# Patient Record
Sex: Female | Born: 1960 | Race: White | Hispanic: No | Marital: Married | Smoking: Never smoker
Health system: Southern US, Community
[De-identification: ages and names within clinical notes are randomized; demographics above are authoritative.]

## PROBLEM LIST (undated history)

## (undated) DIAGNOSIS — D649 Anemia, unspecified: Secondary | ICD-10-CM

## (undated) DIAGNOSIS — T8859XA Other complications of anesthesia, initial encounter: Secondary | ICD-10-CM

## (undated) DIAGNOSIS — T4145XA Adverse effect of unspecified anesthetic, initial encounter: Secondary | ICD-10-CM

## (undated) DIAGNOSIS — R011 Cardiac murmur, unspecified: Secondary | ICD-10-CM

## (undated) DIAGNOSIS — M8580 Other specified disorders of bone density and structure, unspecified site: Secondary | ICD-10-CM

## (undated) DIAGNOSIS — Z8489 Family history of other specified conditions: Secondary | ICD-10-CM

## (undated) DIAGNOSIS — C50919 Malignant neoplasm of unspecified site of unspecified female breast: Secondary | ICD-10-CM

## (undated) DIAGNOSIS — E079 Disorder of thyroid, unspecified: Secondary | ICD-10-CM

## (undated) DIAGNOSIS — R112 Nausea with vomiting, unspecified: Secondary | ICD-10-CM

## (undated) DIAGNOSIS — J302 Other seasonal allergic rhinitis: Secondary | ICD-10-CM

## (undated) DIAGNOSIS — Z9889 Other specified postprocedural states: Secondary | ICD-10-CM

## (undated) DIAGNOSIS — I059 Rheumatic mitral valve disease, unspecified: Secondary | ICD-10-CM

## (undated) HISTORY — DX: Rheumatic mitral valve disease, unspecified: I05.9

## (undated) HISTORY — PX: COLONOSCOPY: SHX174

## (undated) HISTORY — DX: Malignant neoplasm of unspecified site of unspecified female breast: C50.919

## (undated) HISTORY — DX: Disorder of thyroid, unspecified: E07.9

## (undated) HISTORY — DX: Cardiac murmur, unspecified: R01.1

## (undated) HISTORY — PX: OTHER SURGICAL HISTORY: SHX169

## (undated) HISTORY — DX: Anemia, unspecified: D64.9

## (undated) HISTORY — DX: Other specified disorders of bone density and structure, unspecified site: M85.80

## (undated) HISTORY — PX: ANAL FISSURE REPAIR: SHX2312

## (undated) SURGERY — Surgical Case
Anesthesia: *Unknown

---
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
## 2008-01-02 ENCOUNTER — Encounter: Admission: RE | Admit: 2008-01-02 | Discharge: 2008-01-02 | Payer: Self-pay | Admitting: Internal Medicine

## 2008-08-26 ENCOUNTER — Encounter: Admission: RE | Admit: 2008-08-26 | Discharge: 2008-08-26 | Payer: Self-pay | Admitting: Internal Medicine

## 2009-11-19 ENCOUNTER — Encounter: Admission: RE | Admit: 2009-11-19 | Discharge: 2009-11-19 | Payer: Self-pay | Admitting: Family Medicine

## 2013-07-29 ENCOUNTER — Ambulatory Visit (INDEPENDENT_AMBULATORY_CARE_PROVIDER_SITE_OTHER): Payer: BC Managed Care – PPO | Admitting: Interventional Cardiology

## 2013-07-29 ENCOUNTER — Encounter: Payer: Self-pay | Admitting: Interventional Cardiology

## 2013-07-29 VITALS — BP 114/74 | HR 47 | Ht 63.0 in | Wt 114.1 lb

## 2013-07-29 DIAGNOSIS — R7982 Elevated C-reactive protein (CRP): Secondary | ICD-10-CM

## 2013-07-29 DIAGNOSIS — I059 Rheumatic mitral valve disease, unspecified: Secondary | ICD-10-CM

## 2013-07-29 HISTORY — DX: Rheumatic mitral valve disease, unspecified: I05.9

## 2013-07-29 NOTE — Progress Notes (Signed)
Patient ID: Kara Kim, female   DOB: 1961-02-22, 52 y.o.   MRN: [id number]    HPI  Kara Kim is done well since the last office visit. Her great concern about an elevated hs CRP has been lessened. She saw a natural pathic physician, who suggested that she had dietary allergies. After changing her diet, the CRP level has significantly decreased. He denies any cardiac symptoms. She is exercising regularly. She wonders if she needs to take aspirin.  Allergies  Allergen Reactions  . Eggs Or Egg-Derived Products Swelling  . Sulfur Other (See Comments)    Levonne Spiller Syndrome    Current Outpatient Prescriptions  Medication Sig Dispense Refill  . Bisacodyl (LAXATIVE PO) Take 8.6 mg by mouth 2 (two) times daily.      . Cholecalciferol (VITAMIN D-3) 1000 UNITS CAPS Take 2 Units by mouth daily.      . fexofenadine (ALLEGRA) 180 MG tablet Take 180 mg by mouth daily.      . Multiple Vitamin (MULTIVITAMIN) tablet Take 1 tablet by mouth daily.      . Nutritional Supplements (DHEA PO) Take 15 mg by mouth daily.      . Omega-3 Fatty Acids (SUPER OMEGA-3 PO) Take 1 tablet by mouth daily.      . Pregnenolone Micronized POWD 1 tablet by Does not apply route.      . Probiotic Product (PROBIOTIC DAILY PO) Take 1 tablet by mouth daily.      Marland Kitchen thyroid (ARMOUR) 65 MG tablet Take 65 mg by mouth daily.      . TURMERIC CURCUMIN PO Take 665 mg by mouth daily.       No current facility-administered medications for this visit.    History reviewed. No pertinent past medical history.  History reviewed. No pertinent past surgical history.  ROS: Negative  PHYSICAL EXAM BP 114/74  Pulse 47  Ht 5\' 3"  (1.6 m)  Wt 114 lb 1.9 oz (51.764 kg)  BMI 20.22 kg/m2 No murmur, rub, or click. Chest is clear Extremities no edema.  EKG: Sinus bradycardia. First grade AV block. Otherwise, normal tracing.  ASSESSMENT AND PLAN  1. History of mitral valve prolapse, asymptomatic and clinically without evidence.  2.  Elevated C reactive protein, normalize with changes in her diet. I've advised her that it would be okay to discontinue aspirin.  Overall, the patient is doing well and has no particular reason for cardiac followup unless symptoms.

## 2013-07-29 NOTE — Patient Instructions (Addendum)
Your physician recommends that you schedule a follow-up appointment in: AS NEEDED  

## 2013-08-26 ENCOUNTER — Encounter: Payer: Self-pay | Admitting: Interventional Cardiology

## 2016-09-08 DIAGNOSIS — Z719 Counseling, unspecified: Secondary | ICD-10-CM | POA: Insufficient documentation

## 2017-01-03 ENCOUNTER — Ambulatory Visit (INDEPENDENT_AMBULATORY_CARE_PROVIDER_SITE_OTHER): Payer: Self-pay | Admitting: *Deleted

## 2017-01-03 DIAGNOSIS — I781 Nevus, non-neoplastic: Secondary | ICD-10-CM

## 2017-01-03 NOTE — Progress Notes (Signed)
X=.3% Sotradecol administered with a 27g butterfly.  Patient received a total of 2.5cc.  Cutaneous Laser:pulsed mode  810j/cm2 400 ms delay  13 ms Duration 0.5 spot  Total pulses: 559 Total energy 884.98  Total time::07  This patient really has minimal spider veins but she wanted treatment. Used a combo of sclero and cutaneous laser. Tol well. Will follow this nice lady prn.   Compression stockings applied: Yes.

## 2019-12-17 ENCOUNTER — Other Ambulatory Visit: Payer: Self-pay | Admitting: Obstetrics and Gynecology

## 2019-12-17 DIAGNOSIS — R928 Other abnormal and inconclusive findings on diagnostic imaging of breast: Secondary | ICD-10-CM

## 2019-12-22 HISTORY — PX: BREAST BIOPSY: SHX20

## 2019-12-24 ENCOUNTER — Ambulatory Visit
Admission: RE | Admit: 2019-12-24 | Discharge: 2019-12-24 | Disposition: A | Discharge status: Home / Self Care | Source: Ambulatory Visit | Attending: Obstetrics and Gynecology | Admitting: Obstetrics and Gynecology

## 2019-12-24 ENCOUNTER — Other Ambulatory Visit: Payer: Self-pay

## 2019-12-24 ENCOUNTER — Other Ambulatory Visit: Payer: Self-pay | Admitting: Obstetrics and Gynecology

## 2019-12-24 DIAGNOSIS — R928 Other abnormal and inconclusive findings on diagnostic imaging of breast: Secondary | ICD-10-CM

## 2019-12-24 DIAGNOSIS — R921 Mammographic calcification found on diagnostic imaging of breast: Secondary | ICD-10-CM

## 2019-12-26 ENCOUNTER — Ambulatory Visit
Admission: RE | Admit: 2019-12-26 | Discharge: 2019-12-26 | Disposition: A | Discharge status: Home / Self Care | Source: Ambulatory Visit | Attending: Obstetrics and Gynecology | Admitting: Obstetrics and Gynecology

## 2019-12-26 ENCOUNTER — Other Ambulatory Visit: Payer: Self-pay

## 2019-12-26 DIAGNOSIS — R921 Mammographic calcification found on diagnostic imaging of breast: Secondary | ICD-10-CM

## 2020-01-02 ENCOUNTER — Ambulatory Visit: Payer: Self-pay | Admitting: General Surgery

## 2020-01-02 DIAGNOSIS — C50411 Malignant neoplasm of upper-outer quadrant of right female breast: Secondary | ICD-10-CM

## 2020-01-02 DIAGNOSIS — Z170 Estrogen receptor positive status [ER+]: Secondary | ICD-10-CM

## 2020-01-06 ENCOUNTER — Other Ambulatory Visit: Payer: Self-pay | Admitting: General Surgery

## 2020-01-06 ENCOUNTER — Telehealth: Payer: Self-pay | Admitting: Oncology

## 2020-01-06 ENCOUNTER — Encounter: Payer: Self-pay | Admitting: Oncology

## 2020-01-06 DIAGNOSIS — C50411 Malignant neoplasm of upper-outer quadrant of right female breast: Secondary | ICD-10-CM

## 2020-01-06 NOTE — Telephone Encounter (Signed)
Received a new pt referral from Dr. Marlou Starks for dx of breast cancer. Ms. Kara Kim has been cld and scheduled to see Dr. Jana Kim on 3/17 at 4pm w/labs at 3:30pm. She's been made aware to arrive 15 minutes early.

## 2020-01-06 NOTE — Progress Notes (Signed)
[facility]  [phone] [phone]     ID: Kara Kim DOB: Mar 11, 1961  MR#: [id number]  XMI#:[id number]  Patient Care Team: Jonathon Jordan, MD as PCP - General (Family Medicine) [name], MD as Consulting Physician (General Surgery) Maddilyn Campus, Virgie Dad, MD as Consulting Physician (Oncology) [name], MD OTHER MD:  CHIEF COMPLAINT: estrogen receptor positive breast cancer  CURRENT TREATMENT: Definitive surgery pending   HISTORY OF CURRENT ILLNESS: Kara Kim had routine screening mammography showing a possible abnormality in the right breast. She underwent right diagnostic mammography with tomography at [facility] on 12/24/2019 showing: breast density category D; 0.6 cm grouped calcifications in the upper-outer right breast.  Accordingly on 12/26/2019 she proceeded to biopsy of the right breast area in question. 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 Prognostic indicators significant for: estrogen receptor, 100% positive and progesterone receptor, 100% positive, both with strong staining intensity. Proliferation marker Ki67 at 5%. HER2 negative by immunohistochemistry (1+).  The patient's subsequent history is as detailed below.   INTERVAL HISTORY: Shadia was evaluated in the breast cancer clinic on 01/07/2020. Her case was also presented at the multidisciplinary breast cancer conference on the same day. At that time a preliminary plan was proposed: Breast MRI (scheduled for 01/21/2020), then consideration of breast conserving surgery with sentinel lymph node sampling, Oncotype testing, adjuvant radiation and anti-estrogens   REVIEW OF SYSTEMS: There were no specific symptoms leading to the original mammogram, which was routinely scheduled. The patient denies unusual headaches, visual changes, nausea, vomiting,  stiff neck, dizziness, or gait imbalance. There has been no cough, phlegm production, or pleurisy, no chest pain or pressure, and no change in bowel or bladder habits. The patient denies fever, rash, bleeding, unexplained fatigue or unexplained weight loss. A detailed review of systems was otherwise entirely negative.   PAST MEDICAL HISTORY: Past Medical History:  Diagnosis Date  . Breast cancer ([place])   . Heart murmur   . Mitral valve disorders(424.0) 07/29/2013  . Thyroid disease   Persistently elevated CRP  PAST SURGICAL HISTORY: Past Surgical History:  Procedure Laterality Date  . CESAREAN SECTION    Anal fissure repair   FAMILY HISTORY: Family History  Problem Relation Age of Onset  . Lung cancer Mother   . Hypertension Father   . Hypertension Sister   . Seizures Son    She reports lung cancer in her mother, who was a smoker. She died age 52. The patient's father died age 76 from a ruptured aneurysm. His mother had cancer but the patient does not know much about it except "it was not an inherited type". The patient has 2 sisters, no brothers. One sister, [name], is an Materials engineer in [place].   GYNECOLOGIC HISTORY:  No LMP recorded. Menarche: 59 years old Age at first live birth: 59years old [name] 2 LMP mid-50's HRT yes, about a decade, ending at the time of breast cancer diagnosis (MAR 2021) Hysterectomy? no BSO? 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 At home it's just she and her husband Kara Kim, who does custom woodworking. Their 2 sons are [name], 72 and [name] 20, both studying art. The patient attends [employer] DIRECTIVES: in the absence of any document to the contrary the patient's husband is her HCPOA  HEALTH MAINTENANCE: Social History  Tobacco Use  . Smoking status: Never Smoker  Substance Use Topics  . Alcohol use: Yes    Alcohol/week: 4.0 standard drinks    Types: 4  Glasses of wine per week    Comment: occasional  . Drug use: No     Colonoscopy: 2012/ [facility]  PAP: UTD/ [name]  Bone density:    Allergies  Allergen Reactions  . Eggs Or Egg-Derived Products Swelling  . Sulfur Other (See Comments)    [name] Syndrome  . Sulfamethoxazole Rash    Current Outpatient Medications  Medication Sig Dispense Refill  . Biotin 5000 MCG CAPS Take by mouth.    . Bisacodyl (LAXATIVE PO) Take 8.6 mg by mouth 2 (two) times daily.    . Cholecalciferol (VITAMIN D-3) 1000 UNITS CAPS Take 2 Units by mouth daily.    . fexofenadine (ALLEGRA) 180 MG tablet Take 180 mg by mouth daily.    . Multiple Vitamin (MULTIVITAMIN) tablet Take 1 tablet by mouth daily.    . Nutritional Supplements (DHEA PO) Take 15 mg by mouth daily.    . Omega-3 Fatty Acids (SUPER OMEGA-3 PO) Take 1 tablet by mouth daily.    . Pregnenolone Micronized POWD 1 tablet by Does not apply route.    . Probiotic Product (PROBIOTIC DAILY PO) Take 1 tablet by mouth daily.    Marland Kitchen thyroid (ARMOUR) 60 MG tablet Take 60 mg by mouth daily before breakfast.    . TURMERIC CURCUMIN PO Take 665 mg by mouth daily.    Marland Kitchen UNABLE TO FIND Luteolin 50 mg complex    . vitamin k 100 MCG tablet Take 100 mcg by mouth daily. MK-7     No current facility-administered medications for this visit.    OBJECTIVE: middle aged White woman in no acute distress Vitals:   01/07/20 1613  BP: 127/68  Pulse: (!) 58  Resp: 18  Temp: 98.2 F (36.8 C)  SpO2: 100%     Body mass index is 21.29 kg/m.   Wt Readings from Last 3 Encounters:  01/07/20 120 lb 3.2 oz (54.5 kg)  07/29/13 114 lb 1.9 oz (51.8 kg)      ECOG FS:1 - Symptomatic but completely ambulatory  Ocular: Sclerae unicteric, pupils round and equal Ear-nose-throat: Wearing a mask Lymphatic: No cervical or supraclavicular adenopathy Lungs no rales or rhonchi Heart regular rate and rhythm Abd soft, nontender, positive bowel sounds MSK no focal spinal  tenderness, no joint edema Neuro: non-focal, well-oriented, appropriate affect Breasts: the Right breast is s/p recent biopsy; there is no significant ecchymosis; there is no palpable mass; the Left breast is unremarkable; both axillae are benign   LAB RESULTS:  CMP     Component Value Date/Time   NA 144 01/07/2020 1535   K 4.2 01/07/2020 1535   CL 106 01/07/2020 1535   CO2 28 01/07/2020 1535   GLUCOSE 159 (H) 01/07/2020 1535   BUN 13 01/07/2020 1535   CREATININE 1.04 (H) 01/07/2020 1535   CALCIUM 8.8 (L) 01/07/2020 1535   PROT 6.4 (L) 01/07/2020 1535   ALBUMIN 3.9 01/07/2020 1535   AST 23 01/07/2020 1535   ALT 16 01/07/2020 1535   ALKPHOS 62 01/07/2020 1535   BILITOT 0.6 01/07/2020 1535   GFRNONAA 59 (L) 01/07/2020 1535   GFRAA >60 01/07/2020 1535    No results found for: TOTALPROTELP, ALBUMINELP, A1GS, A2GS, BETS, BETA2SER, GAMS, MSPIKE, SPEI  Lab Results  Component Value Date   WBC 3.5 (L) 01/07/2020   NEUTROABS  2.4 01/07/2020   HGB 13.4 01/07/2020   HCT 39.9 01/07/2020   MCV 99.3 01/07/2020   PLT 184 01/07/2020    No results found for: LABCA2  No components found for: YKDXIP382  No results for input(s): INR in the last 168 hours.  No results found for: LABCA2  No results found for: NKN397  No results found for: QBH419  No results found for: FXT024  No results found for: CA2729  No components found for: HGQUANT  No results found for: CEA1 / No results found for: CEA1   No results found for: AFPTUMOR  No results found for: CHROMOGRNA  No results found for: KPAFRELGTCHN, LAMBDASER, KAPLAMBRATIO (kappa/lambda light chains)  No results found for: HGBA, HGBA2QUANT, HGBFQUANT, HGBSQUAN (Hemoglobinopathy evaluation)   No results found for: LDH  No results found for: IRON, TIBC, IRONPCTSAT (Iron and TIBC)  No results found for: FERRITIN  Urinalysis No results found for: COLORURINE, APPEARANCEUR, LABSPEC, PHURINE, GLUCOSEU, HGBUR, BILIRUBINUR,  KETONESUR, PROTEINUR, UROBILINOGEN, NITRITE, LEUKOCYTESUR   STUDIES: MM Digital Diagnostic Unilat R  Result Date: 12/24/2019 CLINICAL DATA:  59 year old female presenting as a recall from screening for possible right breast calcifications EXAM: DIGITAL DIAGNOSTIC RIGHT MAMMOGRAM COMPARISON:  Previous exam(s). ACR Breast Density Category d: The breast tissue is extremely dense, which lowers the sensitivity of mammography. FINDINGS: Spot magnification views were performed for the questioned calcifications in the upper-outer quadrant the right breast. On the additional imaging there is persistence of a 0.6 cm group of punctate and amorphous calcifications. No associated mass identified. IMPRESSION: Grouped calcifications spanning 0.6 cm in the upper outer quadrant of the right breast are indeterminate. Tissue sampling is recommended. RECOMMENDATION: Stereotactic core needle biopsy of the right breast. This will be scheduled at the patient's earliest convenience. BI-RADS CATEGORY  4: Suspicious. Electronically Signed   By: Audie Pinto M.D.   On: 12/24/2019 13:52   MM CLIP PLACEMENT RIGHT  Result Date: 12/26/2019 CLINICAL DATA:  59 year old female presenting for biopsy of right breast calcifications. EXAM: DIAGNOSTIC RIGHT MAMMOGRAM POST STEREOTACTIC BIOPSY COMPARISON:  Previous exam(s). FINDINGS: Mammographic images were obtained following stereotactic guided biopsy of calcifications in the upper outer right breast. The coil biopsy marking clip is in expected position at the site of biopsy. IMPRESSION: Appropriate positioning of the coil shaped biopsy marking clip at the site of biopsy in the upper-outer quadrant of the right breast. Final Assessment: Post Procedure Mammograms for Marker Placement Electronically Signed   By: Audie Pinto M.D.   On: 12/26/2019 10:05   MM RT BREAST BX W LOC DEV 1ST LESION IMAGE BX SPEC STEREO GUIDE  Addendum Date: 12/30/2019   ADDENDUM REPORT: 12/30/2019 08:09  ADDENDUM: Pathology revealed GRADE II INVASIVE DUCTAL CARCINOMA, HIGH GRADE DUCTAL CARCINOMA IN SITU WITH NECROSIS AND CALCIFICATIONS of the RIGHT breast, upper outer. This was found to be concordant by Dr. Audie Pinto. Pathology results were discussed with the patient by telephone. The patient reported doing well after the biopsy with tenderness at the site. Post biopsy instructions and care were reviewed and questions were answered. The patient was encouraged to call [facility] for any additional concerns. Breast MRI recommended for extent of disease. If MRI not performed, RIGHT axillary ultrasound recommended. Surgical consultation has been arranged with Dr. Autumn Messing at [facility] on January 02, 2020. Pathology results reported by Stacie Acres RN on 12/30/2019. Electronically Signed   By: Audie Pinto M.D.   On: 12/30/2019 08:09   Result  Date: 12/30/2019 CLINICAL DATA:  59 year old female presenting for biopsy of calcifications in the right breast. EXAM: RIGHT BREAST STEREOTACTIC CORE NEEDLE BIOPSY COMPARISON:  Previous exams. FINDINGS: The patient and I discussed the procedure of stereotactic-guided biopsy including benefits and alternatives. We discussed the high likelihood of a successful procedure. We discussed the risks of the procedure including infection, bleeding, tissue injury, clip migration, and inadequate sampling. Informed written consent was given. The usual time out protocol was performed immediately prior to the procedure. Using sterile technique and 1% Lidocaine as local anesthetic, under stereotactic guidance, a 9 gauge vacuum assisted device was used to perform core needle biopsy of calcifications in the upper-outer quadrant of the right breast using a superior approach. Specimen radiograph was performed showing at least 3 specimens with calcifications. Specimens with calcifications are identified for pathology. Lesion quadrant: Upper outer  quadrant At the conclusion of the procedure, a coil tissue marker clip was deployed into the biopsy cavity. Follow-up 2-view mammogram was performed and dictated separately. IMPRESSION: Stereotactic-guided biopsy of calcifications in the upper outer right breast. No apparent complications. Electronically Signed: By: Audie Pinto M.D. On: 12/26/2019 10:06     ELIGIBLE FOR AVAILABLE RESEARCH PROTOCOL: AET  ASSESSMENT: 59 y.o. [place] woman status post right breast upper outer quadrant biopsy 12/26/2019 for a clinical T1b NX, stage IA invasive ductal carcinoma, grade 2, estrogen and progesterone receptor strongly positive, HER-2 not amplified, with an MIB-1 of 5%  (a) MRI pending for further evaluation of category D breast density  (1) definitive surgery pending  (2) Oncotype to be obtained from the definitive surgical sample assuming the tumor is larger than 5 mm  (3) adjuvant radiation as appropriate  (4) antiestrogens to start at the completion of local treatment.  PLAN: I met today with Jael to review her new diagnosis. Specifically we discussed the biology of her breast cancer, its diagnosis, staging, treatment  options and prognosis. We first reviewed the fact that cancer is not one disease but more than 100 different diseases and that it is important to keep them separate-- otherwise when friends and relatives discuss their own cancer experiences with Danyia confusion can result. Similarly we explained that if breast cancer spreads to the bone or liver, the patient would not have bone cancer or liver cancer, but breast cancer in the bone and breast cancer in the liver: one cancer in three places-- not 3 different cancers which otherwise would have to be treated in 3 different ways.  We discussed the difference between local and systemic therapy. In terms of loco-regional treatment, lumpectomy plus radiation is equivalent to mastectomy as far as survival is concerned. For this reason,  and because the cosmetic results are generally superior, we recommend breast conserving surgery.   We then discussed the rationale for systemic therapy. There is some risk that this cancer may have already spread to other parts of her body. That risk in a case like hers is very low, but not zero. Patients frequently ask at this point about bone scans, CAT scans and PET scans to find out if they have occult breast cancer somewhere else. The problem is that in early stage disease we are much more likely to find false positives then true cancers and this would expose the patient to unnecessary procedures as well as unnecessary radiation. Scans cannot answer the question the patient really would like to know, which is whether she has microscopic disease elsewhere in her body. For those reasons we do not recommend  them.  Of course we would proceed to aggressive evaluation of any symptoms that might suggest metastatic disease, but that is not the case here.  Next we went over the options for systemic therapy which are anti-estrogens, anti-HER-2 immunotherapy, and chemotherapy. Pattricia does not meet criteria for anti-HER-2 immunotherapy. She is a good candidate for anti-estrogens.  The question of chemotherapy is more complicated. Chemotherapy is most effective in rapidly growing, aggressive tumors. It is much less effective in slow growing cancers, like Cleatus 's. For that reason, assuming the cancer proves larger than 0.5 cm,  we are going to request an Oncotype from the definitive surgical sample, as suggested by NCCN guidelines. That will help [place] make a definitive decision regarding chemotherapy in this case. However Annmargaret understands I expect the results to indicate a good prognosis without the need for chemotherapy.  Eiman wanted to know what caused her cancer. She understands that waiting until after age 5 to have a first child nearly doubles the risk of breast cancer. Use or hormone replacement therapy  further increases that risk. It also increases breast density, which is an independent risk factor. Accordingly I do not see an indication for genetics testing in this case.  She is already scheduled for breast MRI followed by surgery. Chemotherapy would be next but is not anticipated. Adjuvant radiation follows. She will then seem me in June to discuss and initiate anti-estrogens  Jenell has a good understanding of the overall plan. She agrees with it. She knows the goal of treatment in her case is cure. She will call with any problems that may develop before her next visit here.  Total encounter time 60 minutes.Sarajane Jews C. Megin Consalvo, MD 01/07/2020 7:34 PM Medical Oncology and Hematology [facility] [place], [place] [address] Tel. [phone]    Fax. [phone]   This document serves as a record of services personally performed by Lurline Del, MD. It was created on his behalf by Wilburn Mylar, a trained medical scribe. The creation of this record is based on the scribe's personal observations and the provider's statements to them.   I, Lurline Del MD, have reviewed the above documentation for accuracy and completeness, and I agree with the above.    *Total Encounter Time as defined by the Centers for Medicare and Medicaid Services includes, in addition to the face-to-face time of a patient visit (documented in the note above) non-face-to-face time: obtaining and reviewing outside history, ordering and reviewing medications, tests or procedures, care coordination (communications with other health care professionals or caregivers) and documentation in the medical record.

## 2020-01-07 ENCOUNTER — Encounter: Payer: Self-pay | Admitting: Adult Health

## 2020-01-07 ENCOUNTER — Other Ambulatory Visit: Payer: Self-pay | Admitting: *Deleted

## 2020-01-07 ENCOUNTER — Inpatient Hospital Stay

## 2020-01-07 ENCOUNTER — Inpatient Hospital Stay: Attending: Oncology | Admitting: Oncology

## 2020-01-07 ENCOUNTER — Other Ambulatory Visit: Payer: Self-pay

## 2020-01-07 VITALS — BP 127/68 | HR 58 | Temp 98.2°F | Resp 18 | Ht 63.0 in | Wt 120.2 lb

## 2020-01-07 DIAGNOSIS — C50411 Malignant neoplasm of upper-outer quadrant of right female breast: Secondary | ICD-10-CM | POA: Insufficient documentation

## 2020-01-07 DIAGNOSIS — Z170 Estrogen receptor positive status [ER+]: Secondary | ICD-10-CM

## 2020-01-07 LAB — CMP (CANCER CENTER ONLY)
ALT: 16 U/L (ref 0–44)
AST: 23 U/L (ref 15–41)
Albumin: 3.9 g/dL (ref 3.5–5.0)
Alkaline Phosphatase: 62 U/L (ref 38–126)
Anion gap: 10 (ref 5–15)
BUN: 13 mg/dL (ref 6–20)
CO2: 28 mmol/L (ref 22–32)
Calcium: 8.8 mg/dL — ABNORMAL LOW (ref 8.9–10.3)
Chloride: 106 mmol/L (ref 98–111)
Creatinine: 1.04 mg/dL — ABNORMAL HIGH (ref 0.44–1.00)
GFR, Est AFR Am: >60 mL/min (ref >60)
GFR, Estimated: 59 mL/min — ABNORMAL LOW (ref >60)
Glucose, Bld: 159 mg/dL — ABNORMAL HIGH (ref 70–99)
Potassium: 4.2 mmol/L (ref 3.5–5.1)
Sodium: 144 mmol/L (ref 135–145)
Total Bilirubin: 0.6 mg/dL (ref 0.3–1.2)
Total Protein: 6.4 g/dL — ABNORMAL LOW (ref 6.5–8.1)

## 2020-01-07 LAB — CBC WITH DIFFERENTIAL (CANCER CENTER ONLY)
Abs Immature Granulocytes: 0 10*3/uL (ref 0.00–0.07)
Basophils Absolute: 0 10*3/uL (ref 0.0–0.1)
Basophils Relative: 0 %
Eosinophils Absolute: 0 10*3/uL (ref 0.0–0.5)
Eosinophils Relative: 1 %
HCT: 39.9 % (ref 36.0–46.0)
Hemoglobin: 13.4 g/dL (ref 12.0–15.0)
Immature Granulocytes: 0 %
Lymphocytes Relative: 27 %
Lymphs Abs: 1 10*3/uL (ref 0.7–4.0)
MCH: 33.3 pg (ref 26.0–34.0)
MCHC: 33.6 g/dL (ref 30.0–36.0)
MCV: 99.3 fL (ref 80.0–100.0)
Monocytes Absolute: 0.2 10*3/uL (ref 0.1–1.0)
Monocytes Relative: 4 %
Neutro Abs: 2.4 10*3/uL (ref 1.7–7.7)
Neutrophils Relative %: 68 %
Platelet Count: 184 10*3/uL (ref 150–400)
RBC: 4.02 MIL/uL (ref 3.87–5.11)
RDW: 11.7 % (ref 11.5–15.5)
WBC Count: 3.5 10*3/uL — ABNORMAL LOW (ref 4.0–10.5)
nRBC: 0 % (ref 0.0–0.2)

## 2020-01-07 NOTE — Progress Notes (Signed)
Location of Breast Cancer: Malignant neoplasm of upper outer quadrant of right breast.  Did patient present with symptoms (if so, please note symptoms) or was this found on screening mammography?: Routine mammogram.  MRI Breast 01/21/2020:  Mammogram: 6 mm area of calcification in the upper outer quadrant of the right breast.  Histology per Pathology Report: Right Breast biopsy 12/26/2019   Receptor Status: ER(+100%), PR (+100%), Her2-neu (-), Ki-67(5%)    Past/Anticipated interventions by surgeon, if any: Dr. Marlou Starks  01/02/2020 -I have discussed with her in detail the different options for treatment and at this point she favors breast conservation which I think is a very reasonable way of treating her cancer. -She is also a good candidate for sentinel node biopsy. -We will order an MRI study of both breast given that her breast density was categorized as extreme so as not to miss anything. -I will also go ahead and make a referral to medical and radiation oncology so that they can talk to her about adjuvant therapy. -Right Breast lumpectomy with radioactive seed and sentinel lymph node biopsy 01/30/2020  Past/Anticipated interventions by medical oncology, if any: Chemotherapy  Dr. Jana Hakim 01/07/2020 4pm  Lymphedema issues, if any: na Pain issues, if any: no  SAFETY ISSUES:  Prior radiation? no  Pacemaker/ICD?no  Possible current pregnancy? no  Is the patient on methotrexate? no  Current Complaints / other details:  Surgery scheduled for 4/9 no complaints of    Cori Razor, RN 01/07/2020,2:51 PM

## 2020-01-08 ENCOUNTER — Encounter: Payer: Self-pay | Admitting: *Deleted

## 2020-01-08 ENCOUNTER — Other Ambulatory Visit (HOSPITAL_COMMUNITY): Payer: Self-pay | Admitting: General Surgery

## 2020-01-08 ENCOUNTER — Ambulatory Visit
Admission: RE | Admit: 2020-01-08 | Discharge: 2020-01-08 | Disposition: A | Discharge status: Home / Self Care | Source: Ambulatory Visit | Attending: Radiation Oncology | Admitting: Radiation Oncology

## 2020-01-08 DIAGNOSIS — C50411 Malignant neoplasm of upper-outer quadrant of right female breast: Secondary | ICD-10-CM
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
## 2020-01-08 NOTE — Patient Instructions (Signed)
Coronavirus (COVID-19) Are you at risk?  Are you at risk for the Coronavirus (COVID-19)?  To be considered HIGH RISK for Coronavirus (COVID-19), you have to meet the following criteria:  . Traveled to [place], [place], [place], [place] or [place]; or in the [place] to [place], [place], [place], or [place]; and have fever, cough, and shortness of breath within the last 2 weeks of travel OR . Been in close contact with a person diagnosed with COVID-19 within the last 2 weeks and have fever, cough, and shortness of breath . IF YOU DO NOT MEET THESE CRITERIA, YOU ARE CONSIDERED LOW RISK FOR COVID-19.  What to do if you are HIGH RISK for COVID-19?  . If you are having a medical emergency, call 911. . Seek medical care right away. Before you go to a doctor's office, urgent care or emergency department, call ahead and tell them about your recent travel, contact with someone diagnosed with COVID-19, and your symptoms. You should receive instructions from your physician's office regarding next steps of care.  . When you arrive at healthcare provider, tell the healthcare staff immediately you have returned from visiting [place], [place], [place], [place] or [place]; or traveled in the [place] to [place], [place], [place], or [place]; in the last two weeks or you have been in close contact with a person diagnosed with COVID-19 in the last 2 weeks.   . Tell the health care staff about your symptoms: fever, cough and shortness of breath. . After you have been seen by a medical provider, you will be either: o Tested for (COVID-19) and discharged home on quarantine except to seek medical care if symptoms worsen, and asked to  - Stay home and avoid contact with others until you get your results (4-5 days)  - Avoid travel on public transportation if possible (such as bus, train, or airplane) or o Sent to the Emergency Department by EMS for evaluation, COVID-19 testing, and possible  admission depending on your condition and test results.  What to do if you are LOW RISK for COVID-19?  Reduce your risk of any infection by using the same precautions used for avoiding the common cold or flu:  . Wash your hands often with soap and warm water for at least 20 seconds.  If soap and water are not readily available, use an alcohol-based hand sanitizer with at least 60% alcohol.  . If coughing or sneezing, cover your mouth and nose by coughing or sneezing into the elbow areas of your shirt or coat, into a tissue or into your sleeve (not your hands). . Avoid shaking hands with others and consider head nods or verbal greetings only. . Avoid touching your eyes, nose, or mouth with unwashed hands.  . Avoid close contact with people who are sick. . Avoid places or events with large numbers of people in one location, like concerts or sporting events. . Carefully consider travel plans you have or are making. . If you are planning any travel outside or inside the [place], visit the CDC's Travelers' Health webpage for the latest health notices. . If you have some symptoms but not all symptoms, continue to monitor at home and seek medical attention if your symptoms worsen. . If you are having a medical emergency, call 911.   ADDITIONAL HEALTHCARE OPTIONS FOR PATIENTS  [facility] / e-Visit: [email or web address]         [facility]: [phone]  [facility]: [phone]                   [facility]: [phone]   

## 2020-01-09 ENCOUNTER — Telehealth: Payer: Self-pay | Admitting: Oncology

## 2020-01-09 NOTE — Telephone Encounter (Signed)
Scheduled appts per 3/17 los. Pt confirmed new appt date and time.

## 2020-01-21 ENCOUNTER — Other Ambulatory Visit: Payer: Self-pay | Admitting: General Surgery

## 2020-01-21 ENCOUNTER — Other Ambulatory Visit: Payer: Self-pay

## 2020-01-21 ENCOUNTER — Ambulatory Visit
Admission: RE | Admit: 2020-01-21 | Discharge: 2020-01-21 | Disposition: A | Discharge status: Home / Self Care | Source: Ambulatory Visit | Attending: General Surgery | Admitting: General Surgery

## 2020-01-21 ENCOUNTER — Encounter: Payer: Self-pay | Admitting: *Deleted

## 2020-01-21 ENCOUNTER — Encounter (HOSPITAL_BASED_OUTPATIENT_CLINIC_OR_DEPARTMENT_OTHER): Payer: Self-pay | Admitting: General Surgery

## 2020-01-21 DIAGNOSIS — C50411 Malignant neoplasm of upper-outer quadrant of right female breast: Secondary | ICD-10-CM

## 2020-01-21 DIAGNOSIS — R9389 Abnormal findings on diagnostic imaging of other specified body structures: Secondary | ICD-10-CM

## 2020-01-21 MED ORDER — GADOBUTROL 1 MMOL/ML IV SOLN
6.0000 mL | Freq: Once | INTRAVENOUS | Status: AC | PRN
  Administered 2020-01-21: 6 mL via INTRAVENOUS

## 2020-01-22 HISTORY — PX: BREAST BIOPSY: SHX20

## 2020-01-22 HISTORY — PX: BREAST LUMPECTOMY: SHX2

## 2020-01-27 ENCOUNTER — Inpatient Hospital Stay (HOSPITAL_COMMUNITY): Admission: RE | Admit: 2020-01-27 | Source: Ambulatory Visit

## 2020-01-28 ENCOUNTER — Other Ambulatory Visit: Payer: Self-pay

## 2020-01-28 ENCOUNTER — Ambulatory Visit: Admission: RE | Admit: 2020-01-28 | Source: Ambulatory Visit

## 2020-01-28 ENCOUNTER — Ambulatory Visit
Admission: RE | Admit: 2020-01-28 | Discharge: 2020-01-28 | Disposition: A | Discharge status: Home / Self Care | Source: Ambulatory Visit | Attending: General Surgery | Admitting: General Surgery

## 2020-01-28 DIAGNOSIS — R9389 Abnormal findings on diagnostic imaging of other specified body structures: Secondary | ICD-10-CM

## 2020-01-29 ENCOUNTER — Other Ambulatory Visit: Payer: Self-pay | Admitting: General Surgery

## 2020-01-29 ENCOUNTER — Inpatient Hospital Stay: Admission: RE | Admit: 2020-01-29 | Source: Ambulatory Visit

## 2020-01-29 ENCOUNTER — Encounter: Payer: Self-pay | Admitting: *Deleted

## 2020-01-29 DIAGNOSIS — R9389 Abnormal findings on diagnostic imaging of other specified body structures: Secondary | ICD-10-CM

## 2020-01-30 ENCOUNTER — Encounter

## 2020-01-30 ENCOUNTER — Ambulatory Visit (HOSPITAL_BASED_OUTPATIENT_CLINIC_OR_DEPARTMENT_OTHER): Admission: RE | Admit: 2020-01-30 | Source: Home / Self Care | Admitting: General Surgery

## 2020-01-30 ENCOUNTER — Ambulatory Visit
Admission: RE | Admit: 2020-01-30 | Discharge: 2020-01-30 | Disposition: A | Discharge status: Home / Self Care | Source: Ambulatory Visit | Attending: General Surgery | Admitting: General Surgery

## 2020-01-30 ENCOUNTER — Other Ambulatory Visit: Payer: Self-pay | Admitting: Neurology

## 2020-01-30 ENCOUNTER — Other Ambulatory Visit (HOSPITAL_COMMUNITY)

## 2020-01-30 ENCOUNTER — Other Ambulatory Visit: Payer: Self-pay

## 2020-01-30 DIAGNOSIS — R9389 Abnormal findings on diagnostic imaging of other specified body structures: Secondary | ICD-10-CM

## 2020-01-30 HISTORY — DX: Other specified postprocedural states: R11.2

## 2020-01-30 HISTORY — DX: Other specified postprocedural states: Z98.890

## 2020-01-30 HISTORY — DX: Other complications of anesthesia, initial encounter: T88.59XA

## 2020-01-30 SURGERY — BREAST LUMPECTOMY WITH RADIOACTIVE SEED AND SENTINEL LYMPH NODE BIOPSY
Anesthesia: General | Laterality: Right

## 2020-01-30 MED ORDER — GADOBUTROL 1 MMOL/ML IV SOLN
6.0000 mL | Freq: Once | INTRAVENOUS | Status: AC | PRN
  Administered 2020-01-30: 6 mL via INTRAVENOUS

## 2020-02-02 ENCOUNTER — Encounter: Payer: Self-pay | Admitting: *Deleted

## 2020-02-03 ENCOUNTER — Encounter: Payer: Self-pay | Admitting: *Deleted

## 2020-02-05 ENCOUNTER — Encounter: Payer: Self-pay | Admitting: *Deleted

## 2020-02-05 ENCOUNTER — Ambulatory Visit: Payer: Self-pay | Admitting: General Surgery

## 2020-02-05 ENCOUNTER — Other Ambulatory Visit: Payer: Self-pay | Admitting: General Surgery

## 2020-02-05 DIAGNOSIS — Z170 Estrogen receptor positive status [ER+]: Secondary | ICD-10-CM

## 2020-02-05 DIAGNOSIS — C50411 Malignant neoplasm of upper-outer quadrant of right female breast: Secondary | ICD-10-CM

## 2020-02-09 ENCOUNTER — Encounter: Payer: Self-pay | Admitting: *Deleted

## 2020-02-09 ENCOUNTER — Encounter: Payer: Self-pay | Admitting: Oncology

## 2020-02-10 ENCOUNTER — Institutional Professional Consult (permissible substitution): Admitting: Plastic Surgery

## 2020-02-11 ENCOUNTER — Encounter: Payer: Self-pay | Admitting: *Deleted

## 2020-02-11 NOTE — Progress Notes (Signed)
[id number], [address] [place] [address] Phone: [phone] Fax: [facility] [place], [address] AT [address] [place] [address] Phone: [phone] Fax: [phone]      Your procedure is scheduled on Thursday, April 29th.  Report to [facility] Main Entrance "A" at 8:15 A.M., and check in at the Admitting office.  Call this number if you have problems the morning of surgery:  [phone]  Call [phone] if you have any questions prior to your surgery date Monday-Friday 8am-4pm    Remember:  Do not eat after midnight the night before your surgery  You may drink clear liquids until 7:15 AM the morning of your surgery.   Clear liquids allowed are: Water, Non-Citrus Juices (without pulp), Carbonated Beverages, Clear Tea, Black Coffee Only, and Gatorade    Take these medicines the morning of surgery with A SIP OF WATER   Allegra - if needed  Thyroid ([place])  Flonase nasal spray - if needed  As of today, STOP taking any Aspirin (unless otherwise instructed by your surgeon) and Aspirin containing products, Aleve, Naproxen, Ibuprofen, Motrin, Advil, Goody's, BC's, all herbal medications, fish oil, and all vitamins.                      Do not wear jewelry, make up, or nail polish            Do not wear lotions, powders, perfumes, or deodorant.            Do not shave 48 hours prior to surgery.              Do not bring valuables to the hospital.            [facility] is not responsible for any belongings or valuables.  Do NOT Smoke (Tobacco/Vapping) or drink Alcohol 24 hours prior to your procedure If you use a CPAP at night, you may bring all equipment for your overnight stay.   Contacts, glasses, dentures or bridgework may not be worn into surgery.      For patients admitted to the hospital,  discharge time will be determined by your treatment team.   Patients discharged the day of surgery will not be allowed to drive home, and someone needs to stay with them for 24 hours.    Special instructions:   [place]- Preparing For Surgery  Before surgery, you can play an important role. Because skin is not sterile, your skin needs to be as free of germs as possible. You can reduce the number of germs on your skin by washing with CHG (chlorahexidine gluconate) Soap before surgery.  CHG is an antiseptic cleaner which kills germs and bonds with the skin to continue killing germs even after washing.    Oral Hygiene is also important to reduce your risk of infection.  Remember - BRUSH YOUR TEETH THE MORNING OF SURGERY WITH YOUR REGULAR TOOTHPASTE  Please do not use if you have an allergy to CHG or antibacterial soaps. If your skin becomes reddened/irritated stop using the CHG.  Do not shave (including legs and underarms) for at least 48 hours prior to first CHG shower. It is OK to shave your face.  Please follow these instructions carefully.   1. Shower the NIGHT BEFORE SURGERY and the MORNING OF SURGERY with CHG Soap.  2. If you chose to wash your hair, wash your hair first as usual with your normal shampoo.  3. After you shampoo, rinse your hair and body thoroughly to remove the shampoo.  4. Use CHG as you would any other liquid soap. You can apply CHG directly to the skin and wash gently with a scrungie or a clean washcloth.   5. Apply the CHG Soap to your body ONLY FROM THE NECK DOWN.  Do not use on open wounds or open sores. Avoid contact with your eyes, ears, mouth and genitals (private parts). Wash Face and genitals (private parts)  with your normal soap.   6. Wash thoroughly, paying special attention to the area where your surgery will be performed.  7. Thoroughly rinse your body with warm water from the neck down.  8. DO NOT shower/wash with your normal soap after using  and rinsing off the CHG Soap.  9. Pat yourself dry with a CLEAN TOWEL.  10. Wear CLEAN PAJAMAS to bed the night before surgery, wear comfortable clothes the morning of surgery  11. Place CLEAN SHEETS on your bed the night of your first shower and DO NOT SLEEP WITH PETS.   Day of Surgery:   Do not apply any deodorants/lotions.  Please wear clean clothes to the hospital/surgery center.   Remember to brush your teeth WITH YOUR REGULAR TOOTHPASTE.   Please read over the following fact sheets that you were given.

## 2020-02-12 ENCOUNTER — Encounter (HOSPITAL_COMMUNITY): Payer: Self-pay

## 2020-02-12 ENCOUNTER — Other Ambulatory Visit: Payer: Self-pay

## 2020-02-12 ENCOUNTER — Encounter (HOSPITAL_COMMUNITY)
Admission: RE | Admit: 2020-02-12 | Discharge: 2020-02-12 | Disposition: A | Discharge status: Home / Self Care | Source: Ambulatory Visit | Attending: General Surgery | Admitting: General Surgery

## 2020-02-12 ENCOUNTER — Encounter: Payer: Self-pay | Admitting: *Deleted

## 2020-02-12 DIAGNOSIS — C50911 Malignant neoplasm of unspecified site of right female breast: Secondary | ICD-10-CM | POA: Insufficient documentation

## 2020-02-12 DIAGNOSIS — Z01818 Encounter for other preprocedural examination: Secondary | ICD-10-CM | POA: Insufficient documentation

## 2020-02-12 DIAGNOSIS — E079 Disorder of thyroid, unspecified: Secondary | ICD-10-CM | POA: Diagnosis not present

## 2020-02-12 DIAGNOSIS — R011 Cardiac murmur, unspecified: Secondary | ICD-10-CM | POA: Diagnosis not present

## 2020-02-12 DIAGNOSIS — Z79899 Other long term (current) drug therapy: Secondary | ICD-10-CM | POA: Diagnosis not present

## 2020-02-12 HISTORY — DX: Other seasonal allergic rhinitis: J30.2

## 2020-02-12 LAB — CBC
HCT: 43.4 % (ref 36.0–46.0)
Hemoglobin: 14.3 g/dL (ref 12.0–15.0)
MCH: 32.7 pg (ref 26.0–34.0)
MCHC: 32.9 g/dL (ref 30.0–36.0)
MCV: 99.3 fL (ref 80.0–100.0)
Platelets: 231 10*3/uL (ref 150–400)
RBC: 4.37 MIL/uL (ref 3.87–5.11)
RDW: 11.5 % (ref 11.5–15.5)
WBC: 4.5 10*3/uL (ref 4.0–10.5)
nRBC: 0 % (ref 0.0–0.2)

## 2020-02-12 LAB — BASIC METABOLIC PANEL
Anion gap: 9 (ref 5–15)
BUN: 21 mg/dL — ABNORMAL HIGH (ref 6–20)
CO2: 29 mmol/L (ref 22–32)
Calcium: 9.5 mg/dL (ref 8.9–10.3)
Chloride: 102 mmol/L (ref 98–111)
Creatinine, Ser: 0.91 mg/dL (ref 0.44–1.00)
GFR calc Af Amer: >60 mL/min (ref >60)
GFR calc non Af Amer: >60 mL/min (ref >60)
Glucose, Bld: 83 mg/dL (ref 70–99)
Potassium: 4.1 mmol/L (ref 3.5–5.1)
Sodium: 140 mmol/L (ref 135–145)

## 2020-02-12 NOTE — Progress Notes (Signed)
PCP: Dr. Mylinda Latina [facility] Cardiologist: Denies (States she formally saw Dr. Daneen Schick, had ECHO ~15 years ago and he "undiagnosed" her with the heart murmur.)  EKG: Today CXR: n/a ECHO: ~15 years ago, unable to find in Epic, was told "normal" Stress Test: n/a Cardiac Cath: n/a  Going for Covid testing Monday 4/26, denies any Covid symptoms  Patient denies shortness of breath, fever, cough, and chest pain at PAT appointment.  Patient verbalized understanding of instructions provided today at the PAT appointment.  Patient asked to review instructions at home and day of surgery.

## 2020-02-12 NOTE — Progress Notes (Signed)
Called office to clarify surgical orders/consent/medications. Clarification received from Dr. Marlou Starks, Duplicate orders discontinued.

## 2020-02-13 NOTE — Progress Notes (Signed)
Anesthesia Chart Review:  Case: [name] Date/Time: 02/19/20 1016   Procedure: RIGHT BREAST LUMPECTOMY X 2  WITH RADIOACTIVE SEED AND SENTINEL LYMPH NODE BIOPSY (Right Breast)   Anesthesia type: General   Pre-op diagnosis: RIGHT BREAST CANCER   Location: [facility] OR ROOM 09 / [place] OR   Surgeons: [name], MD     BCG 02/18/20 at 10:15 AM, nuclear med 02/19/20 at 9:45 AM  DISCUSSION: Patient is a 59 year old female scheduled for the above procedure.   History includes never smoker, post-operative N/V, right breast cancer (diagnosed 12/26/19, invasive ductal carcinoma grade II, high grade DCIS; biopsy 01/30/20: invasive mammary carcinoma), murmur/mitral valve disorder, thyroid disease (on [place]), cosmetic surgery (eye lid lift, abdominoplasty/liposuction in [place] 2017 with post-operative seroma).   In regards to murmur/MV disorder history, she apparently had an echo ~ 15 years ago with finding of MVP. She was seen by cardiologist Daneen Schick, MD in 2014. He did not hear a murmur and no clinical symptoms of MVP, so as needed cardiology follow-up recommended.   She denied shortness of breath, cough, fever, chest pain at PAT RN visit.  Presurgical COVID-19 test is scheduled for 02/16/2020.  Anesthesia team to evaluate on the day of surgery.   VS: BP 134/73   Pulse (!) 53   Temp 36.4 C (Oral)   Resp 18   Ht 5\' 3"  (1.6 m)   Wt 54.8 kg   SpO2 100%   BMI 21.42 kg/m  Menstrual status is documented as: Postmenopausal.    PROVIDERS: Patient, No Pcp Per - Magrinat, Sarajane Jews, MD is HEM-ONC [name], MD is RAD-ONC - She is not followed routinely by cardiology. She was evaluated by Daneen Schick, MD on 07/29/13 for MVP history--she was asymptomatic and clinically without evidence so as needed follow-up recommended.    LABS: Labs reviewed: Acceptable for surgery. (all labs ordered are listed, but only abnormal results are displayed)  Labs Reviewed  BASIC METABOLIC PANEL - Abnormal; Notable for the  following components:      Result Value   BUN 21 (*)    All other components within normal limits  CBC    EKG: Rate 52 bpm Sinus bradycardia with 1st degree A-V block Otherwise normal ECG No old tracing to compare Confirmed by Daneen Schick [phone]) on 02/12/2020 1:03:03 PM - 07/29/13 EKG from visit with Dr. Tamala Julian also showed SB with first degree AV block, rage 47 bpm.   CV: She reported an echocardiogram > 15 years ago.   Past Medical History:  Diagnosis Date  . Breast cancer ([place])   . Complication of anesthesia   . Heart murmur   . Mitral valve disorders(424.0) 07/29/2013  . PONV (postoperative nausea and vomiting)   . Seasonal allergies   . Thyroid disease     Past Surgical History:  Procedure Laterality Date  . ANAL FISSURE REPAIR    . CESAREAN SECTION    . eye lid lift    . tummy tuck      MEDICATIONS: . Cholecalciferol (VITAMIN D-3) 125 MCG (5000 UT) TABS  . fexofenadine (ALLEGRA) 180 MG tablet  . fluticasone (FLONASE) 50 MCG/ACT nasal spray  . Multiple Vitamin (MULTIVITAMIN) tablet  . Probiotic Product (PROBIOTIC DAILY PO)  . thyroid (ARMOUR) 60 MG tablet   No current facility-administered medications for this encounter.    Kara Gianotti, PA-C Surgical Short Stay/Anesthesiology [facility] Phone [phone] [facility] Phone [phone] 02/13/2020 3:22 PM

## 2020-02-13 NOTE — Anesthesia Preprocedure Evaluation (Addendum)
Anesthesia Evaluation  Patient identified by MRN, date of birth, ID band Patient awake    Reviewed: Allergy & Precautions, NPO status , Patient's Chart, lab work & pertinent test results  History of Anesthesia Complications (+) PONV  Airway Mallampati: I  TM Distance: >3 FB Neck ROM: Full    Dental  (+) Dental Advisory Given, Teeth Intact   Pulmonary neg pulmonary ROS,  02/16/2020 SARS coronavirus NEG   breath sounds clear to auscultation       Cardiovascular negative cardio ROS  + Valvular Problems/Murmurs (remote h/o MVP)  Rhythm:Regular Rate:Normal     Neuro/Psych negative neurological ROS     GI/Hepatic negative GI ROS, Neg liver ROS,   Endo/Other  Hypothyroidism   Renal/GU negative Renal ROS     Musculoskeletal   Abdominal   Peds  Hematology negative hematology ROS (+)   Anesthesia Other Findings Breast cancer  Reproductive/Obstetrics                            Anesthesia Physical Anesthesia Plan  ASA: II  Anesthesia Plan: General   Post-op Pain Management: GA combined w/ Regional for post-op pain   Induction: Intravenous  PONV Risk Score and Plan: 4 or greater and Scopolamine patch - Pre-op, Dexamethasone and Ondansetron  Airway Management Planned: Oral ETT  Additional Equipment:   Intra-op Plan:   Post-operative Plan: Extubation in OR  Informed Consent: I have reviewed the patients History and Physical, chart, labs and discussed the procedure including the risks, benefits and alternatives for the proposed anesthesia with the patient or authorized representative who has indicated his/her understanding and acceptance.     Dental advisory given  Plan Discussed with: CRNA and Surgeon  Anesthesia Plan Comments: (PAT note written 02/13/2020 by Myra Gianotti, PA-C. Plan routine monitors, GETA (pt nauseated), with pectoralis block for post op analgesia)       Anesthesia Quick Evaluation

## 2020-02-16 ENCOUNTER — Other Ambulatory Visit (HOSPITAL_COMMUNITY)
Admission: RE | Admit: 2020-02-16 | Discharge: 2020-02-16 | Disposition: A | Discharge status: Home / Self Care | Source: Ambulatory Visit | Attending: General Surgery | Admitting: General Surgery

## 2020-02-16 DIAGNOSIS — Z20822 Contact with and (suspected) exposure to covid-19: Secondary | ICD-10-CM | POA: Diagnosis not present

## 2020-02-16 DIAGNOSIS — Z01812 Encounter for preprocedural laboratory examination: Secondary | ICD-10-CM | POA: Insufficient documentation

## 2020-02-16 LAB — SARS CORONAVIRUS 2 (TAT 6-24 HRS): SARS Coronavirus 2: NEGATIVE

## 2020-02-17 ENCOUNTER — Encounter: Payer: Self-pay | Admitting: *Deleted

## 2020-02-18 ENCOUNTER — Other Ambulatory Visit: Payer: Self-pay

## 2020-02-18 ENCOUNTER — Ambulatory Visit
Admission: RE | Admit: 2020-02-18 | Discharge: 2020-02-18 | Disposition: A | Discharge status: Home / Self Care | Source: Ambulatory Visit | Attending: General Surgery | Admitting: General Surgery

## 2020-02-18 DIAGNOSIS — C50411 Malignant neoplasm of upper-outer quadrant of right female breast: Secondary | ICD-10-CM

## 2020-02-18 DIAGNOSIS — Z170 Estrogen receptor positive status [ER+]: Secondary | ICD-10-CM

## 2020-02-19 ENCOUNTER — Ambulatory Visit
Admission: RE | Admit: 2020-02-19 | Discharge: 2020-02-19 | Disposition: A | Discharge status: Home / Self Care | Source: Ambulatory Visit | Attending: General Surgery | Admitting: General Surgery

## 2020-02-19 ENCOUNTER — Encounter (HOSPITAL_COMMUNITY)
Admission: RE | Admit: 2020-02-19 | Discharge: 2020-02-19 | Disposition: A | Discharge status: Home / Self Care | Source: Ambulatory Visit | Attending: General Surgery | Admitting: General Surgery

## 2020-02-19 ENCOUNTER — Encounter (HOSPITAL_COMMUNITY)
Admission: RE | Disposition: A | Discharge status: Home / Self Care | Payer: Self-pay | Source: Home / Self Care | Attending: General Surgery

## 2020-02-19 ENCOUNTER — Other Ambulatory Visit: Payer: Self-pay

## 2020-02-19 ENCOUNTER — Encounter (HOSPITAL_COMMUNITY): Payer: Self-pay | Admitting: General Surgery

## 2020-02-19 ENCOUNTER — Ambulatory Visit (HOSPITAL_COMMUNITY)
Admission: RE | Admit: 2020-02-19 | Discharge: 2020-02-19 | Disposition: A | Discharge status: Home / Self Care | Attending: General Surgery | Admitting: General Surgery

## 2020-02-19 ENCOUNTER — Ambulatory Visit (HOSPITAL_COMMUNITY): Admitting: Certified Registered Nurse Anesthetist

## 2020-02-19 ENCOUNTER — Ambulatory Visit (HOSPITAL_COMMUNITY): Admitting: Vascular Surgery

## 2020-02-19 DIAGNOSIS — Z79899 Other long term (current) drug therapy: Secondary | ICD-10-CM | POA: Insufficient documentation

## 2020-02-19 DIAGNOSIS — C50411 Malignant neoplasm of upper-outer quadrant of right female breast: Secondary | ICD-10-CM | POA: Insufficient documentation

## 2020-02-19 DIAGNOSIS — Z170 Estrogen receptor positive status [ER+]: Secondary | ICD-10-CM | POA: Insufficient documentation

## 2020-02-19 DIAGNOSIS — E039 Hypothyroidism, unspecified: Secondary | ICD-10-CM | POA: Diagnosis not present

## 2020-02-19 DIAGNOSIS — Z801 Family history of malignant neoplasm of trachea, bronchus and lung: Secondary | ICD-10-CM | POA: Diagnosis not present

## 2020-02-19 HISTORY — PX: BREAST LUMPECTOMY WITH RADIOACTIVE SEED AND SENTINEL LYMPH NODE BIOPSY: SHX6550

## 2020-02-19 SURGERY — BREAST LUMPECTOMY WITH RADIOACTIVE SEED AND SENTINEL LYMPH NODE BIOPSY
Anesthesia: General | Site: Breast | Laterality: Right

## 2020-02-19 MED ORDER — CHLORHEXIDINE GLUCONATE CLOTH 2 % EX PADS: 6.0000 | MEDICATED_PAD | Freq: Once | CUTANEOUS | Status: DC

## 2020-02-19 MED ORDER — ACETAMINOPHEN 500 MG PO TABS: 1000.0000 mg | ORAL_TABLET | ORAL | Status: AC

## 2020-02-19 MED ORDER — EPHEDRINE 5 MG/ML INJ
INTRAVENOUS | Status: AC
  Filled 2020-02-19: qty 10

## 2020-02-19 MED ORDER — 0.9 % SODIUM CHLORIDE (POUR BTL) OPTIME
TOPICAL | Status: DC | PRN
  Administered 2020-02-19: 11:00:00 1000 mL

## 2020-02-19 MED ORDER — MEPERIDINE HCL 25 MG/ML IJ SOLN: 6.2500 mg | INTRAMUSCULAR | Status: DC | PRN

## 2020-02-19 MED ORDER — CEFAZOLIN SODIUM-DEXTROSE 2-4 GM/100ML-% IV SOLN
2.0000 g | INTRAVENOUS | Status: AC
  Administered 2020-02-19: 2 g via INTRAVENOUS

## 2020-02-19 MED ORDER — PROPOFOL 10 MG/ML IV BOLUS
INTRAVENOUS | Status: DC | PRN
  Administered 2020-02-19 (×2): 20 mg via INTRAVENOUS
  Administered 2020-02-19: 120 mg via INTRAVENOUS
  Administered 2020-02-19: 20 mg via INTRAVENOUS

## 2020-02-19 MED ORDER — CELECOXIB 200 MG PO CAPS
ORAL_CAPSULE | ORAL | Status: AC
  Administered 2020-02-19: 200 mg via ORAL
  Filled 2020-02-19: qty 1

## 2020-02-19 MED ORDER — TECHNETIUM TC 99M SULFUR COLLOID FILTERED
1.0000 | Freq: Once | INTRAVENOUS | Status: AC | PRN
  Administered 2020-02-19: 10:00:00 1 via INTRADERMAL

## 2020-02-19 MED ORDER — PHENYLEPHRINE HCL-NACL 10-0.9 MG/250ML-% IV SOLN: INTRAVENOUS | Status: DC | PRN

## 2020-02-19 MED ORDER — ROCURONIUM BROMIDE 10 MG/ML (PF) SYRINGE
PREFILLED_SYRINGE | INTRAVENOUS | Status: DC | PRN
  Administered 2020-02-19: 50 mg via INTRAVENOUS

## 2020-02-19 MED ORDER — MIDAZOLAM HCL 2 MG/2ML IJ SOLN: 2.0000 mg | Freq: Once | INTRAMUSCULAR | Status: AC

## 2020-02-19 MED ORDER — SUGAMMADEX SODIUM 200 MG/2ML IV SOLN
INTRAVENOUS | Status: DC | PRN
  Administered 2020-02-19: 200 mg via INTRAVENOUS

## 2020-02-19 MED ORDER — ACETAMINOPHEN 500 MG PO TABS: 1000.0000 mg | ORAL_TABLET | Freq: Once | ORAL | Status: DC

## 2020-02-19 MED ORDER — EPHEDRINE SULFATE-NACL 50-0.9 MG/10ML-% IV SOSY
PREFILLED_SYRINGE | INTRAVENOUS | Status: DC | PRN
  Administered 2020-02-19 (×3): 5 mg via INTRAVENOUS
  Administered 2020-02-19: 10 mg via INTRAVENOUS
  Administered 2020-02-19: 5 mg via INTRAVENOUS

## 2020-02-19 MED ORDER — ACETAMINOPHEN 500 MG PO TABS
ORAL_TABLET | ORAL | Status: AC
  Administered 2020-02-19: 1000 mg via ORAL
  Filled 2020-02-19: qty 2

## 2020-02-19 MED ORDER — ROCURONIUM BROMIDE 10 MG/ML (PF) SYRINGE
PREFILLED_SYRINGE | INTRAVENOUS | Status: AC
  Filled 2020-02-19: qty 10

## 2020-02-19 MED ORDER — BUPIVACAINE HCL 0.25 % IJ SOLN
INTRAMUSCULAR | Status: DC | PRN
  Administered 2020-02-19: 10 mL

## 2020-02-19 MED ORDER — LIDOCAINE 2% (20 MG/ML) 5 ML SYRINGE
INTRAMUSCULAR | Status: AC
  Filled 2020-02-19: qty 5

## 2020-02-19 MED ORDER — SCOPOLAMINE 1 MG/3DAYS TD PT72: 1.0000 | MEDICATED_PATCH | TRANSDERMAL | Status: DC

## 2020-02-19 MED ORDER — PHENYLEPHRINE 40 MCG/ML (10ML) SYRINGE FOR IV PUSH (FOR BLOOD PRESSURE SUPPORT)
PREFILLED_SYRINGE | INTRAVENOUS | Status: DC | PRN
  Administered 2020-02-19: 120 ug via INTRAVENOUS

## 2020-02-19 MED ORDER — LACTATED RINGERS IV SOLN: INTRAVENOUS | Status: DC | PRN

## 2020-02-19 MED ORDER — GABAPENTIN 300 MG PO CAPS: 300.0000 mg | ORAL_CAPSULE | ORAL | Status: AC

## 2020-02-19 MED ORDER — METHYLENE BLUE 0.5 % INJ SOLN
INTRAVENOUS | Status: AC
  Filled 2020-02-19: qty 10

## 2020-02-19 MED ORDER — DEXAMETHASONE SODIUM PHOSPHATE 10 MG/ML IJ SOLN
INTRAMUSCULAR | Status: DC | PRN
  Administered 2020-02-19: 4 mg via INTRAVENOUS

## 2020-02-19 MED ORDER — PROMETHAZINE HCL 25 MG/ML IJ SOLN: 6.2500 mg | INTRAMUSCULAR | Status: DC | PRN

## 2020-02-19 MED ORDER — CELECOXIB 200 MG PO CAPS: 200.0000 mg | ORAL_CAPSULE | ORAL | Status: AC

## 2020-02-19 MED ORDER — BUPIVACAINE-EPINEPHRINE (PF) 0.5% -1:200000 IJ SOLN
INTRAMUSCULAR | Status: DC | PRN
  Administered 2020-02-19: 25 mL

## 2020-02-19 MED ORDER — HYDROCODONE-ACETAMINOPHEN 5-325 MG PO TABS: 1.0000 | ORAL_TABLET | Freq: Four times a day (QID) | ORAL | 0 refills | Status: DC | PRN

## 2020-02-19 MED ORDER — SCOPOLAMINE 1 MG/3DAYS TD PT72
MEDICATED_PATCH | TRANSDERMAL | Status: AC
  Administered 2020-02-19: 1.5 mg via TRANSDERMAL
  Filled 2020-02-19: qty 1

## 2020-02-19 MED ORDER — FENTANYL CITRATE (PF) 250 MCG/5ML IJ SOLN
INTRAMUSCULAR | Status: AC
  Filled 2020-02-19: qty 5

## 2020-02-19 MED ORDER — SODIUM CHLORIDE (PF) 0.9 % IJ SOLN
INTRAVENOUS | Status: DC | PRN
  Administered 2020-02-19: 1 mL

## 2020-02-19 MED ORDER — CEFAZOLIN SODIUM-DEXTROSE 2-4 GM/100ML-% IV SOLN
INTRAVENOUS | Status: AC
  Filled 2020-02-19: qty 100

## 2020-02-19 MED ORDER — FENTANYL CITRATE (PF) 100 MCG/2ML IJ SOLN
INTRAMUSCULAR | Status: AC
  Administered 2020-02-19: 100 ug via INTRAVENOUS
  Filled 2020-02-19: qty 2

## 2020-02-19 MED ORDER — MIDAZOLAM HCL 2 MG/2ML IJ SOLN: 0.5000 mg | Freq: Once | INTRAMUSCULAR | Status: DC | PRN

## 2020-02-19 MED ORDER — FENTANYL CITRATE (PF) 100 MCG/2ML IJ SOLN: 25.0000 ug | INTRAMUSCULAR | Status: DC | PRN

## 2020-02-19 MED ORDER — FENTANYL CITRATE (PF) 100 MCG/2ML IJ SOLN: 100.0000 ug | Freq: Once | INTRAMUSCULAR | Status: AC

## 2020-02-19 MED ORDER — MIDAZOLAM HCL 2 MG/2ML IJ SOLN
INTRAMUSCULAR | Status: AC
  Administered 2020-02-19: 2 mg via INTRAVENOUS
  Filled 2020-02-19: qty 2

## 2020-02-19 MED ORDER — BUPIVACAINE HCL (PF) 0.25 % IJ SOLN
INTRAMUSCULAR | Status: AC
  Filled 2020-02-19: qty 30

## 2020-02-19 MED ORDER — LIDOCAINE 2% (20 MG/ML) 5 ML SYRINGE
INTRAMUSCULAR | Status: DC | PRN
  Administered 2020-02-19: 40 mg via INTRAVENOUS

## 2020-02-19 MED ORDER — ONDANSETRON HCL 4 MG/2ML IJ SOLN
INTRAMUSCULAR | Status: DC | PRN
  Administered 2020-02-19: 4 mg via INTRAVENOUS

## 2020-02-19 MED ORDER — PROPOFOL 10 MG/ML IV BOLUS
INTRAVENOUS | Status: AC
  Filled 2020-02-19: qty 20

## 2020-02-19 MED ORDER — GABAPENTIN 300 MG PO CAPS
ORAL_CAPSULE | ORAL | Status: AC
  Administered 2020-02-19: 300 mg via ORAL
  Filled 2020-02-19: qty 1

## 2020-02-19 MED ORDER — ONDANSETRON HCL 4 MG/2ML IJ SOLN
INTRAMUSCULAR | Status: AC
  Filled 2020-02-19: qty 2

## 2020-02-19 SURGICAL SUPPLY — 43 items
ADH SKN CLS APL DERMABOND .7 (GAUZE/BANDAGES/DRESSINGS) ×1
APL PRP STRL LF DISP 70% ISPRP (MISCELLANEOUS) ×1
APPLIER CLIP 9.375 MED OPEN (MISCELLANEOUS) ×3
APR CLP MED 9.3 20 MLT OPN (MISCELLANEOUS) ×1
BINDER BREAST LRG (GAUZE/BANDAGES/DRESSINGS) ×2 IMPLANT
BINDER BREAST XLRG (GAUZE/BANDAGES/DRESSINGS) IMPLANT
CANISTER SUCT 3000ML PPV (MISCELLANEOUS) ×3 IMPLANT
CHLORAPREP W/TINT 26 (MISCELLANEOUS) ×3 IMPLANT
CLIP APPLIE 9.375 MED OPEN (MISCELLANEOUS) ×1 IMPLANT
CNTNR URN SCR LID CUP LEK RST (MISCELLANEOUS) ×1 IMPLANT
CONT SPEC 4OZ STRL OR WHT (MISCELLANEOUS) ×12
COVER PROBE W GEL 5X96 (DRAPES) ×3 IMPLANT
COVER SURGICAL LIGHT HANDLE (MISCELLANEOUS) ×3 IMPLANT
COVER WAND RF STERILE (DRAPES) IMPLANT
DERMABOND ADVANCED (GAUZE/BANDAGES/DRESSINGS) ×2
DERMABOND ADVANCED .7 DNX12 (GAUZE/BANDAGES/DRESSINGS) ×1 IMPLANT
DEVICE DUBIN SPECIMEN MAMMOGRA (MISCELLANEOUS) ×3 IMPLANT
DRAPE CHEST BREAST 15X10 FENES (DRAPES) ×3 IMPLANT
ELECT COATED BLADE 2.86 ST (ELECTRODE) ×3 IMPLANT
ELECT REM PT RETURN 9FT ADLT (ELECTROSURGICAL) ×3
ELECTRODE REM PT RTRN 9FT ADLT (ELECTROSURGICAL) ×1 IMPLANT
GAUZE SPONGE 4X4 12PLY STRL (GAUZE/BANDAGES/DRESSINGS) ×2 IMPLANT
GLOVE BIO SURGEON STRL SZ7.5 (GLOVE) ×6 IMPLANT
GOWN STRL REUS W/ TWL LRG LVL3 (GOWN DISPOSABLE) ×2 IMPLANT
GOWN STRL REUS W/TWL LRG LVL3 (GOWN DISPOSABLE) ×6
KIT BASIN OR (CUSTOM PROCEDURE TRAY) ×3 IMPLANT
KIT MARKER MARGIN INK (KITS) ×3 IMPLANT
LIGHT WAVEGUIDE WIDE FLAT (MISCELLANEOUS) IMPLANT
NDL 18GX1X1/2 (RX/OR ONLY) (NEEDLE) IMPLANT
NDL FILTER BLUNT 18X1 1/2 (NEEDLE) IMPLANT
NDL HYPO 25GX1X1/2 BEV (NEEDLE) ×1 IMPLANT
NEEDLE 18GX1X1/2 (RX/OR ONLY) (NEEDLE) IMPLANT
NEEDLE FILTER BLUNT 18X 1/2SAF (NEEDLE) ×2
NEEDLE FILTER BLUNT 18X1 1/2 (NEEDLE) ×1 IMPLANT
NEEDLE HYPO 25GX1X1/2 BEV (NEEDLE) ×3 IMPLANT
NS IRRIG 1000ML POUR BTL (IV SOLUTION) ×3 IMPLANT
PACK GENERAL/GYN (CUSTOM PROCEDURE TRAY) ×3 IMPLANT
PENCIL SMOKE EVACUATOR (MISCELLANEOUS) ×2 IMPLANT
SUT MNCRL AB 4-0 PS2 18 (SUTURE) ×6 IMPLANT
SUT VIC AB 3-0 SH 18 (SUTURE) ×3 IMPLANT
SYR CONTROL 10ML LL (SYRINGE) ×3 IMPLANT
TOWEL GREEN STERILE (TOWEL DISPOSABLE) ×3 IMPLANT
TOWEL GREEN STERILE FF (TOWEL DISPOSABLE) ×3 IMPLANT

## 2020-02-19 NOTE — Anesthesia Postprocedure Evaluation (Signed)
Anesthesia Post Note  Patient: Kara Kim  Procedure(s) Performed: RIGHT BREAST LUMPECTOMY X 2  WITH RADIOACTIVE SEED AND SENTINEL LYMPH NODE BIOPSY (Right Breast)     Patient location during evaluation: PACU Anesthesia Type: General Level of consciousness: awake and alert, oriented and patient cooperative Pain management: pain level controlled Vital Signs Assessment: post-procedure vital signs reviewed and stable Respiratory status: spontaneous breathing, nonlabored ventilation, respiratory function stable and patient connected to nasal cannula oxygen Cardiovascular status: blood pressure returned to baseline and stable Postop Assessment: no apparent nausea or vomiting Anesthetic complications: no    Last Vitals:  Vitals:   02/19/20 1230 02/19/20 1242  BP: 113/64 130/77  Pulse: 65 71  Resp: 17 15  Temp:  (!) 36.3 C  SpO2: 99% 100%    Last Pain:  Vitals:   02/19/20 1242  TempSrc:   PainSc: 0-No pain                 [name],E. Atarah Cadogan

## 2020-02-19 NOTE — Transfer of Care (Signed)
Immediate Anesthesia Transfer of Care Note  Patient: Kara Kim  Procedure(s) Performed: RIGHT BREAST LUMPECTOMY X 2  WITH RADIOACTIVE SEED AND SENTINEL LYMPH NODE BIOPSY (Right Breast)  Patient Location: PACU  Anesthesia Type:GA combined with regional for post-op pain  Level of Consciousness: drowsy  Airway & Oxygen Therapy: Patient Spontanous Breathing  Post-op Assessment: Report given to RN and Post -op Vital signs reviewed and stable  Post vital signs: Reviewed and stable  Last Vitals:  Vitals Value Taken Time  BP 122/63 02/19/20 1217  Temp    Pulse 64 02/19/20 1218  Resp 13 02/19/20 1218  SpO2 97 % 02/19/20 1218  Vitals shown include unvalidated device data.  Last Pain:  Vitals:   02/19/20 1010  TempSrc:   PainSc: 0-No pain      Patients Stated Pain Goal: 3 ([id number])  Complications: No apparent anesthesia complications

## 2020-02-19 NOTE — H&P (Signed)
Kara Kim  Location: [facility] Patient #: [id number] DOB: 02-17-1961 Married / Language: English / Race: White Female   History of Present Illness  The patient is a 59 year old female who presents with breast cancer. We are asked to see the patient in consultation by Dr. [name] to evaluate her for a new right breast cancer. The patient is a 59 year old white female who recently went for a routine screening mammogram. At that time she was found to have a 6 mm area of calcification in the upper outer quadrant of the right breast. 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 She does have a history of lung cancer in her mother but she was a smoker. The patient does not smoke. She is otherwise in pretty good health but does take a lot of supplements. She has stopped her estrogen and progesterone replacement.   Past Surgical History  Anal Fissure Repair  Breast Biopsy  Right. Cesarean Section - 1  Oral Surgery   Diagnostic Studies History  Colonoscopy  5-10 years ago Mammogram  within last year Pap Smear  1-5 years ago  Allergies  Sulfa Antibiotics  Eggs or Egg-derived Products  Allergies Reconciled   Medication History  NP Thyroid (60MG Tablet, Oral) Active. Medications Reconciled  Social History  Alcohol use  Occasional alcohol use. Caffeine use  Coffee. No drug use  Tobacco use  Never smoker.  Family History  Anesthetic complications  Mother. Cancer  Mother. Depression  Sister. Heart Disease  Father. Heart disease in female family member before age 52  Hypertension  Father, Sister. Seizure disorder  Son.  Pregnancy / Birth History  Age at menarche  59 years. Age of menopause  60-60 Gravida  3 Length (months) of breastfeeding  3-6 Maternal age  44-40 Para  2  Other Problems Breast Cancer  General anesthesia -  complications  Heart murmur  Thyroid Disease     Review of Systems  General Present- Fatigue. Not Present- Appetite Loss, Chills, Fever, Night Sweats, Weight Gain and Weight Loss. Skin Not Present- Change in Wart/Mole, Dryness, Hives, Jaundice, New Lesions, Non-Healing Wounds, Rash and Ulcer. HEENT Present- Seasonal Allergies and Wears glasses/contact lenses. Not Present- Earache, Hearing Loss, Hoarseness, Nose Bleed, Oral Ulcers, Ringing in the Ears, Sinus Pain, Sore Throat, Visual Disturbances and Yellow Eyes. Respiratory Not Present- Bloody sputum, Chronic Cough, Difficulty Breathing, Snoring and Wheezing. Breast Present- Breast Mass. Not Present- Breast Pain, Nipple Discharge and Skin Changes. Cardiovascular Not Present- Chest Pain, Difficulty Breathing Lying Down, Leg Cramps, Palpitations, Rapid Heart Rate, Shortness of Breath and Swelling of Extremities. Gastrointestinal Not Present- Abdominal Pain, Bloating, Bloody Stool, Change in Bowel Habits, Chronic diarrhea, Constipation, Difficulty Swallowing, Excessive gas, Gets full quickly at meals, Hemorrhoids, Indigestion, Nausea, Rectal Pain and Vomiting. Female Genitourinary Present- Frequency. Not Present- Nocturia, Painful Urination, Pelvic Pain and Urgency. Musculoskeletal Present- Joint Pain. Not Present- Back Pain, Joint Stiffness, Muscle Pain, Muscle Weakness and Swelling of Extremities. Neurological Not Present- Decreased Memory, Fainting, Headaches, Numbness, Seizures, Tingling, Tremor, Trouble walking and Weakness. Psychiatric Not Present- Anxiety, Bipolar, Change in Sleep Pattern, Depression, Fearful and Frequent crying. Endocrine Present- Cold Intolerance. Not Present- Excessive Hunger, Hair Changes, Heat Intolerance, Hot flashes and New Diabetes. Hematology Present- Easy Bruising. Not Present- Blood Thinners, Excessive bleeding, Gland problems, HIV and Persistent Infections.  Vitals  Weight: 122 lb Height: 63in Body  Surface Area: 1.57 m Body Mass Index: 21.61 kg/m  Temp.: 51F  Pulse: 85 (Regular)  BP: 124/74(Sitting, Left Arm, Standard)       Physical Exam  General Mental Status-Alert. General Appearance-Consistent with stated age. Hydration-Well hydrated. Voice-Normal.  Head and Neck Head-normocephalic, atraumatic with no lesions or palpable masses. Trachea-midline. Thyroid Gland Characteristics - normal size and consistency.  Eye Eyeball - Bilateral-Extraocular movements intact. Sclera/Conjunctiva - Bilateral-No scleral icterus.  Chest and Lung Exam Chest and lung exam reveals -quiet, even and easy respiratory effort with no use of accessory muscles and on auscultation, normal breath sounds, no adventitious sounds and normal vocal resonance. Inspection Chest Wall - Normal. Back - normal.  Breast Note: There is no palpable mass in either breast. There is no palpable axillary, supraclavicular, or cervical lymphadenopathy. She does seem to have symmetric dense breast tissue.   Cardiovascular Cardiovascular examination reveals -normal heart sounds, regular rate and rhythm with no murmurs and normal pedal pulses bilaterally.  Abdomen Inspection Inspection of the abdomen reveals - No Hernias. Skin - Scar - no surgical scars. Palpation/Percussion Palpation and Percussion of the abdomen reveal - Soft, Non Tender, No Rebound tenderness, No Rigidity (guarding) and No hepatosplenomegaly. Auscultation Auscultation of the abdomen reveals - Bowel sounds normal.  Neurologic Neurologic evaluation reveals -alert and oriented x 3 with no impairment of recent or remote memory. Mental Status-Normal.  Musculoskeletal Normal Exam - Left-Upper Extremity Strength Normal and Lower Extremity Strength Normal. Normal Exam - Right-Upper Extremity Strength Normal and Lower Extremity Strength Normal.  Lymphatic Head & Neck  General Head & Neck Lymphatics:  Bilateral - Description - Normal. Axillary  General Axillary Region: Bilateral - Description - Normal. Tenderness - Non Tender. Femoral & Inguinal  Generalized Femoral & Inguinal Lymphatics: Bilateral - Description - Normal. Tenderness - Non Tender.    Assessment & Plan  MALIGNANT NEOPLASM OF UPPER-OUTER QUADRANT OF RIGHT BREAST IN FEMALE, ESTROGEN RECEPTOR POSITIVE (C50.411) Impression: The patient appears to have a small stage I cancer in the upper outer quadrant of the right breast. I have discussed with her in detail the different options for treatment and at this point she favors breast conservation which I think is a very reasonable way of treating her cancer. She is also a good candidate for sentinel node biopsy. I have discussed with her in detail the risks and benefits of the surgery as well as some of the technical aspects including the use of a radioactive seed for localization and she understands and wishes to proceed. I will plan for a right breast radioactive seed localized lumpectomy and sentinel node biopsy. We will order an MRI study of both breast given that her breast density was categorized as extreme so as not to miss anything. I will also go ahead and make a referral to medical and radiation oncology so that they can talk to her about adjuvant therapy. This patient encounter took 60 minutes today to perform the following: take history, perform exam, review outside records, interpret imaging, counsel the patient on their diagnosis and document encounter, findings & plan in the EHR Current Plans Referred to Oncology, for evaluation and follow up (Oncology). Routine. Pt Education - Breast Cancer: discussed with patient and provided information. MRI BREAST BILATERAL DIAGNOSTIC (77047)(ACR 1 )(DSN [id number])(G-Code G1004(MF)) (Clinical Indications: Breast cancer, axillary eval)  MRI shows 3 areas in the upper outer quadrant of the right breast that have been biopsied and confirmed  as cancer.  She has elected for breast conserving surgery  with the understanding that there is a higher risk of positive margins given the larger area.

## 2020-02-19 NOTE — Anesthesia Procedure Notes (Signed)
Procedure Name: Intubation Performed by: Milford Cage, CRNA Pre-anesthesia Checklist: Patient identified, Emergency Drugs available, Suction available and Patient being monitored Patient Re-evaluated:Patient Re-evaluated prior to induction Oxygen Delivery Method: Circle System Utilized Preoxygenation: Pre-oxygenation with 100% oxygen Induction Type: IV induction Ventilation: Mask ventilation without difficulty Laryngoscope Size: Mac and 3 Grade View: Grade I Tube type: Oral Tube size: 7.0 mm Number of attempts: 1 Airway Equipment and Method: Stylet and Oral airway Placement Confirmation: ETT inserted through vocal cords under direct vision,  positive ETCO2 and breath sounds checked- equal and bilateral Secured at: 21 cm Tube secured with: Tape Dental Injury: Teeth and Oropharynx as per pre-operative assessment

## 2020-02-19 NOTE — Interval H&P Note (Signed)
History and Physical Interval Note:  02/19/2020 9:54 AM  Kara Kim  has presented today for surgery, with the diagnosis of RIGHT BREAST CANCER.  The various methods of treatment have been discussed with the patient and family. After consideration of risks, benefits and other options for treatment, the patient has consented to  Procedure(s): RIGHT BREAST LUMPECTOMY X 2  WITH RADIOACTIVE SEED AND SENTINEL LYMPH NODE BIOPSY (Right) as a surgical intervention.  The patient's history has been reviewed, patient examined, no change in status, stable for surgery.  I have reviewed the patient's chart and labs.  Questions were answered to the patient's satisfaction.     Autumn Messing III

## 2020-02-19 NOTE — Op Note (Signed)
02/19/2020  12:03 PM  PATIENT:  Kara Kim  59 y.o. female  PRE-OPERATIVE DIAGNOSIS:  RIGHT BREAST CANCER  POST-OPERATIVE DIAGNOSIS:  RIGHT BREAST CANCER  PROCEDURE:  Procedure(s): RIGHT BREAST LUMPECTOMY X 2  WITH RADIOACTIVE SEED LOCALIZATION AND DEEP RIGHT AXILLARY SENTINEL LYMPH NODE BIOPSY (Right)  SURGEON:  Surgeon(s) and Role:    * [name], MD - Primary  PHYSICIAN ASSISTANT:   ASSISTANTS: none   ANESTHESIA:   local and general  EBL:  minimal   BLOOD ADMINISTERED:none  DRAINS: none   LOCAL MEDICATIONS USED:  MARCAINE     SPECIMEN:  Source of Specimen:  right breast tissue with additional medial margin and sentinel node x 2  DISPOSITION OF SPECIMEN:  PATHOLOGY  COUNTS:  YES  TOURNIQUET:  * No tourniquets in log *  DICTATION: .Dragon Dictation   After informed consent was obtained the patient was brought to the operating room and placed in the supine position on the operating table.  After adequate induction of general anesthesia the patient's right chest, breast, and axillary area were prepped with ChloraPrep, allowed to dry, and draped in usual sterile manner.  An appropriate timeout was performed.  Previously to I-125 seeds were placed in the upper outer quadrant of the right breast to bracket an area of 3 areas breast cancer.  Earlier in the day the patient also underwent injection 1 mCi of technetium sulfur colloid in the subareolar position on the right.  Because the location of the seeds was fairly far in the upper outer right breast we decided to do the surgery through 1 incision.  Because the cancers were located in a radial position in the upper outer quadrant I elected to make an elliptical incision in the skin in a radial fashion overlying the area of the radioactive seeds.  The neoprobe was set to I-125 and the seeds were readily identified.  The incision was carried through the skin and subcutaneous tissue sharply with electrocautery.  Dissection  was then carried around the radioactive seeds while checking the area of radioactivity frequently until the dissection also reached the chest wall.  Once the specimen was removed it was oriented with the appropriate paint colors.  A specimen radiograph was obtained that showed the clips and seeds to be well within the specimen.  The specimen was then sent to pathology for further evaluation.  Upon examining the lumpectomy cavity there was a palpable nodule along the lower medial edge and this was also excised sharply and sent as an additional medial margin and marked appropriately.  The patient had very dense breast tissue.  At this point the neoprobe was set to technetium.  The dissection was then carried through the current incision into the deep right axillary space under the direction of the neoprobe.  I was able to identify a single hot lymph node although the signal was very weak.  This lymph node and one other palpable lymph node were excised sharply with the electrocautery and the surrounding small vessels and lymphatics were controlled with clips.  Ex vivo counts on sentinel node #1 was approximately 10.  There was some faint radioactive signal throughout the right axilla but no other palpable lymph node.  I did remove some additional right axillary tissue and sent this separately.  Hemostasis was achieved using the Bovie electrocautery.  The cavity was marked with clips.  The wound was then irrigated with copious amounts of saline.  The right axilla was then closed with interrupted  3-0 Vicryl stitches.  The superior and inferior breast tissue was then mobilized by separating the breast tissue from the overlying subcutaneous tissue and skin.  This allowed [place] to bring the breast tissue back together in the upper outer quadrant with layers of interrupted 3-0 Vicryl stitches.  The skin was then closed with a running 4-0 Monocryl subcuticular stitch.  Dermabond dressings were applied.  The patient tolerated the  procedure well.  At the end of the case all needle sponge instrument counts were correct.  The patient was then awakened and taken to recovery in stable condition.  PLAN OF CARE: Discharge to home after PACU  PATIENT DISPOSITION:  PACU - hemodynamically stable.   Delay start of Pharmacological VTE agent (>24hrs) due to surgical blood loss or risk of bleeding: not applicable

## 2020-02-19 NOTE — Anesthesia Procedure Notes (Signed)
Anesthesia Regional Block: Pectoralis block   Pre-Anesthetic Checklist: ,, timeout performed, Correct Patient, Correct Site, Correct Laterality, Correct Procedure, Correct Position, site marked, Risks and benefits discussed,  Surgical consent,  Pre-op evaluation,  At surgeon's request and post-op pain management  Laterality: Right  Prep: chloraprep       Needles:  Injection technique: Single-shot  Needle Type: Echogenic Needle     Needle Length: 9cm  Needle Gauge: 21     Additional Needles:   Procedures:,,,, ultrasound used (permanent image in chart),,,,  Narrative:  Start time: 02/19/2020 9:54 AM End time: 02/19/2020 10:00 AM Injection made incrementally with aspirations every 5 mL.  Performed by: Personally  Anesthesiologist: Annye Asa, MD  Additional Notes: Pt identified in Holding room.  Monitors applied. Working IV access confirmed. Sterile prep, drape R clavicle and pec.  #21ga ECHOgenic needle sub pec major with US guidance.  25cc 0.5% Bupivacaine with 1:200k epi injected incrementally after negative test dose.  Patient asymptomatic, VSS, no heme aspirated, tolerated well.  Jenita Seashore, MD

## 2020-02-24 ENCOUNTER — Telehealth: Payer: Self-pay | Admitting: *Deleted

## 2020-02-24 NOTE — Telephone Encounter (Signed)
VM left by the patient stating she had her surgery last week and wanted to verify that the " tumors are being sent for the [facility] type testing "  " Dr Jana Hakim said he would send if my tumors were greater then 5 cm I had 3 of them removed "  Pt is requesting a call verifying above has been done.  Return call number given as [phone].  This note will be forwarded to Nurse Navigators per above request.

## 2020-02-25 ENCOUNTER — Encounter: Payer: Self-pay | Admitting: *Deleted

## 2020-02-25 ENCOUNTER — Telehealth: Payer: Self-pay | Admitting: *Deleted

## 2020-02-25 NOTE — Telephone Encounter (Signed)
Ordered oncotype per Dr. Magrinat.  Faxed requisition to pathology. 

## 2020-02-26 ENCOUNTER — Other Ambulatory Visit: Payer: Self-pay | Admitting: Oncology

## 2020-02-26 LAB — SURGICAL PATHOLOGY

## 2020-03-01 ENCOUNTER — Telehealth: Payer: Self-pay | Admitting: Radiation Oncology

## 2020-03-01 ENCOUNTER — Other Ambulatory Visit: Payer: Self-pay | Admitting: Oncology

## 2020-03-01 NOTE — Telephone Encounter (Signed)
I called the patient back and we discussed further her concerns about radiation planning and appts.

## 2020-03-08 ENCOUNTER — Encounter: Payer: Self-pay | Admitting: *Deleted

## 2020-03-08 ENCOUNTER — Telehealth: Payer: Self-pay | Admitting: *Deleted

## 2020-03-08 NOTE — Telephone Encounter (Signed)
Received oncotype results of 5/3%.  Patient aware.

## 2020-03-11 ENCOUNTER — Ambulatory Visit
Admission: RE | Admit: 2020-03-11 | Discharge: 2020-03-11 | Disposition: A | Discharge status: Home / Self Care | Source: Ambulatory Visit | Attending: Radiation Oncology | Admitting: Radiation Oncology

## 2020-03-11 ENCOUNTER — Encounter: Payer: Self-pay | Admitting: Radiation Oncology

## 2020-03-11 ENCOUNTER — Other Ambulatory Visit: Payer: Self-pay

## 2020-03-11 VITALS — BP 108/79 | HR 67 | Temp 97.9°F | Resp 18 | Ht 63.0 in | Wt 124.0 lb

## 2020-03-11 DIAGNOSIS — C50411 Malignant neoplasm of upper-outer quadrant of right female breast: Secondary | ICD-10-CM | POA: Diagnosis not present

## 2020-03-11 DIAGNOSIS — Z170 Estrogen receptor positive status [ER+]: Secondary | ICD-10-CM | POA: Diagnosis not present
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
## 2020-03-16 ENCOUNTER — Encounter: Payer: Self-pay | Admitting: *Deleted

## 2020-03-16 ENCOUNTER — Telehealth: Payer: Self-pay | Admitting: Oncology

## 2020-03-16 NOTE — Telephone Encounter (Signed)
Scheduled appts per 5/25 sch msg. Pt confirmed appt date and time.

## 2020-03-18 DIAGNOSIS — C50411 Malignant neoplasm of upper-outer quadrant of right female breast: Secondary | ICD-10-CM | POA: Diagnosis not present

## 2020-03-19 ENCOUNTER — Encounter: Payer: Self-pay | Admitting: Radiation Oncology

## 2020-03-23 ENCOUNTER — Ambulatory Visit
Admission: RE | Admit: 2020-03-23 | Discharge: 2020-03-23 | Disposition: A | Discharge status: Home / Self Care | Source: Ambulatory Visit | Attending: Radiation Oncology | Admitting: Radiation Oncology

## 2020-03-23 ENCOUNTER — Other Ambulatory Visit: Payer: Self-pay

## 2020-03-23 ENCOUNTER — Telehealth: Payer: Self-pay | Admitting: Oncology

## 2020-03-23 DIAGNOSIS — C50411 Malignant neoplasm of upper-outer quadrant of right female breast: Secondary | ICD-10-CM | POA: Insufficient documentation

## 2020-03-23 DIAGNOSIS — Z170 Estrogen receptor positive status [ER+]: Secondary | ICD-10-CM | POA: Insufficient documentation

## 2020-03-23 DIAGNOSIS — Z510 Encounter for antineoplastic radiation therapy: Secondary | ICD-10-CM | POA: Diagnosis not present

## 2020-03-23 NOTE — Telephone Encounter (Signed)
Reschedule appt per pt request and 6/1 sch mg. Pt confirmed new appt date and time.

## 2020-03-24 ENCOUNTER — Other Ambulatory Visit: Payer: Self-pay

## 2020-03-24 ENCOUNTER — Telehealth: Payer: Self-pay | Admitting: Oncology

## 2020-03-24 ENCOUNTER — Ambulatory Visit
Admission: RE | Admit: 2020-03-24 | Discharge: 2020-03-24 | Disposition: A | Discharge status: Home / Self Care | Source: Ambulatory Visit | Attending: Radiation Oncology | Admitting: Radiation Oncology

## 2020-03-24 DIAGNOSIS — Z510 Encounter for antineoplastic radiation therapy: Secondary | ICD-10-CM | POA: Diagnosis not present

## 2020-03-24 NOTE — Telephone Encounter (Signed)
Scheduled appts per 6/2 secure chat from RN Val to schedule sooner appts with GM for pt to discuss treatment options.

## 2020-03-25 ENCOUNTER — Ambulatory Visit
Admission: RE | Admit: 2020-03-25 | Discharge: 2020-03-25 | Disposition: A | Discharge status: Home / Self Care | Source: Ambulatory Visit | Attending: Radiation Oncology | Admitting: Radiation Oncology

## 2020-03-25 ENCOUNTER — Other Ambulatory Visit: Payer: Self-pay

## 2020-03-25 DIAGNOSIS — Z510 Encounter for antineoplastic radiation therapy: Secondary | ICD-10-CM | POA: Diagnosis not present

## 2020-03-25 NOTE — Progress Notes (Signed)
Pt here for patient teaching.  Pt given Radiation and You booklet, skin care instructions, Alra deodorant and Sonafine.  Reviewed areas of pertinence such as fatigue, hair loss, skin changes, breast tenderness and breast swelling . Pt able to give teach back of to pat skin and use unscented/gentle soap,apply Sonafine bid, avoid applying anything to skin within 4 hours of treatment, avoid wearing an under wire bra and to use an electric razor if they must shave. Pt verbalizes understanding of information given and will contact nursing with any questions or concerns.     LaToya M. Silva RN, BSN             

## 2020-03-26 ENCOUNTER — Ambulatory Visit
Admission: RE | Admit: 2020-03-26 | Discharge: 2020-03-26 | Disposition: A | Discharge status: Home / Self Care | Source: Ambulatory Visit | Attending: Radiation Oncology | Admitting: Radiation Oncology

## 2020-03-26 ENCOUNTER — Encounter: Payer: Self-pay | Admitting: Oncology

## 2020-03-26 ENCOUNTER — Other Ambulatory Visit: Payer: Self-pay

## 2020-03-26 DIAGNOSIS — Z510 Encounter for antineoplastic radiation therapy: Secondary | ICD-10-CM | POA: Diagnosis not present

## 2020-03-26 DIAGNOSIS — C50411 Malignant neoplasm of upper-outer quadrant of right female breast: Secondary | ICD-10-CM

## 2020-03-26 MED ORDER — SONAFINE EX EMUL
1.0000 "application " | Freq: Two times a day (BID) | CUTANEOUS | Status: DC
  Administered 2020-03-26: 1 via TOPICAL

## 2020-03-26 MED ORDER — ALRA NON-METALLIC DEODORANT (RAD-ONC)
1.0000 "application " | Freq: Once | TOPICAL | Status: AC
  Administered 2020-03-26: 1 via TOPICAL

## 2020-03-29 ENCOUNTER — Other Ambulatory Visit: Payer: Self-pay

## 2020-03-29 ENCOUNTER — Ambulatory Visit
Admission: RE | Admit: 2020-03-29 | Discharge: 2020-03-29 | Disposition: A | Discharge status: Home / Self Care | Source: Ambulatory Visit | Attending: Radiation Oncology | Admitting: Radiation Oncology

## 2020-03-29 DIAGNOSIS — Z510 Encounter for antineoplastic radiation therapy: Secondary | ICD-10-CM | POA: Diagnosis not present

## 2020-03-30 ENCOUNTER — Other Ambulatory Visit: Payer: Self-pay

## 2020-03-30 ENCOUNTER — Ambulatory Visit
Admission: RE | Admit: 2020-03-30 | Discharge: 2020-03-30 | Disposition: A | Discharge status: Home / Self Care | Source: Ambulatory Visit | Attending: Radiation Oncology | Admitting: Radiation Oncology

## 2020-03-30 DIAGNOSIS — Z510 Encounter for antineoplastic radiation therapy: Secondary | ICD-10-CM | POA: Diagnosis not present

## 2020-03-31 ENCOUNTER — Ambulatory Visit
Admission: RE | Admit: 2020-03-31 | Discharge: 2020-03-31 | Disposition: A | Discharge status: Home / Self Care | Source: Ambulatory Visit | Attending: Radiation Oncology | Admitting: Radiation Oncology

## 2020-03-31 ENCOUNTER — Other Ambulatory Visit: Payer: Self-pay

## 2020-03-31 DIAGNOSIS — Z510 Encounter for antineoplastic radiation therapy: Secondary | ICD-10-CM | POA: Diagnosis not present

## 2020-04-01 ENCOUNTER — Ambulatory Visit
Admission: RE | Admit: 2020-04-01 | Discharge: 2020-04-01 | Disposition: A | Discharge status: Home / Self Care | Source: Ambulatory Visit | Attending: Radiation Oncology | Admitting: Radiation Oncology

## 2020-04-01 ENCOUNTER — Encounter (HOSPITAL_COMMUNITY): Payer: Self-pay | Admitting: Oncology

## 2020-04-01 ENCOUNTER — Other Ambulatory Visit: Payer: Self-pay

## 2020-04-01 DIAGNOSIS — Z510 Encounter for antineoplastic radiation therapy: Secondary | ICD-10-CM | POA: Diagnosis not present

## 2020-04-02 ENCOUNTER — Ambulatory Visit
Admission: RE | Admit: 2020-04-02 | Discharge: 2020-04-02 | Disposition: A | Discharge status: Home / Self Care | Source: Ambulatory Visit | Attending: Radiation Oncology | Admitting: Radiation Oncology

## 2020-04-02 ENCOUNTER — Other Ambulatory Visit: Payer: Self-pay

## 2020-04-02 DIAGNOSIS — Z510 Encounter for antineoplastic radiation therapy: Secondary | ICD-10-CM | POA: Diagnosis not present

## 2020-04-05 ENCOUNTER — Other Ambulatory Visit

## 2020-04-05 ENCOUNTER — Ambulatory Visit: Admitting: Oncology

## 2020-04-05 ENCOUNTER — Ambulatory Visit
Admission: RE | Admit: 2020-04-05 | Discharge: 2020-04-05 | Disposition: A | Discharge status: Home / Self Care | Source: Ambulatory Visit | Attending: Radiation Oncology | Admitting: Radiation Oncology

## 2020-04-05 ENCOUNTER — Other Ambulatory Visit: Payer: Self-pay

## 2020-04-05 DIAGNOSIS — Z510 Encounter for antineoplastic radiation therapy: Secondary | ICD-10-CM | POA: Diagnosis not present

## 2020-04-06 ENCOUNTER — Other Ambulatory Visit: Payer: Self-pay

## 2020-04-06 ENCOUNTER — Ambulatory Visit
Admission: RE | Admit: 2020-04-06 | Discharge: 2020-04-06 | Disposition: A | Discharge status: Home / Self Care | Source: Ambulatory Visit | Attending: Radiation Oncology | Admitting: Radiation Oncology

## 2020-04-06 DIAGNOSIS — Z510 Encounter for antineoplastic radiation therapy: Secondary | ICD-10-CM | POA: Diagnosis not present

## 2020-04-07 ENCOUNTER — Ambulatory Visit
Admission: RE | Admit: 2020-04-07 | Discharge: 2020-04-07 | Disposition: A | Discharge status: Home / Self Care | Source: Ambulatory Visit | Attending: Radiation Oncology | Admitting: Radiation Oncology

## 2020-04-07 ENCOUNTER — Other Ambulatory Visit: Payer: Self-pay

## 2020-04-07 DIAGNOSIS — Z510 Encounter for antineoplastic radiation therapy: Secondary | ICD-10-CM | POA: Diagnosis not present

## 2020-04-08 ENCOUNTER — Ambulatory Visit
Admission: RE | Admit: 2020-04-08 | Discharge: 2020-04-08 | Disposition: A | Discharge status: Home / Self Care | Source: Ambulatory Visit | Attending: Radiation Oncology | Admitting: Radiation Oncology

## 2020-04-08 ENCOUNTER — Other Ambulatory Visit: Payer: Self-pay

## 2020-04-08 DIAGNOSIS — Z510 Encounter for antineoplastic radiation therapy: Secondary | ICD-10-CM | POA: Diagnosis not present

## 2020-04-09 ENCOUNTER — Ambulatory Visit
Admission: RE | Admit: 2020-04-09 | Discharge: 2020-04-09 | Disposition: A | Discharge status: Home / Self Care | Source: Ambulatory Visit | Attending: Radiation Oncology | Admitting: Radiation Oncology

## 2020-04-09 ENCOUNTER — Other Ambulatory Visit: Payer: Self-pay

## 2020-04-09 ENCOUNTER — Ambulatory Visit: Admitting: Radiation Oncology

## 2020-04-09 DIAGNOSIS — Z510 Encounter for antineoplastic radiation therapy: Secondary | ICD-10-CM | POA: Diagnosis not present

## 2020-04-12 ENCOUNTER — Ambulatory Visit
Admission: RE | Admit: 2020-04-12 | Discharge: 2020-04-12 | Disposition: A | Discharge status: Home / Self Care | Source: Ambulatory Visit | Attending: Radiation Oncology | Admitting: Radiation Oncology

## 2020-04-12 ENCOUNTER — Other Ambulatory Visit: Payer: Self-pay

## 2020-04-12 DIAGNOSIS — Z510 Encounter for antineoplastic radiation therapy: Secondary | ICD-10-CM | POA: Diagnosis not present

## 2020-04-12 NOTE — Progress Notes (Signed)
[facility] [facility]  [phone] [phone]) [phone]     ID: Kara Kim DOB: 1961/09/27  MR#: [id number]  GUY#:[id number]  Patient Care Team: Patient, No Pcp Per as PCP - General (General Practice) [name], MD as Consulting Physician (General Surgery) Magrinat, Virgie Dad, MD as Consulting Physician (Oncology) [name], MD as Consulting Physician (Radiation Oncology) [name], MD as Consulting Physician (Gastroenterology) [name], MD as Consulting Physician (Dermatology) [name], MD (Family Medicine) [name], MD as Consulting Physician (Obstetrics and Gynecology) [name], RN as Oncology Nurse Navigator [name], RN as Oncology Nurse Navigator [name], MD OTHER MD:  CHIEF COMPLAINT: estrogen receptor positive breast cancer  CURRENT TREATMENT: adjuvant radiation therapy   INTERVAL HISTORY: Kara Kim returns today for follow up of her estrogen receptor positive breast cancer. She was evaluated in the breast cancer clinic on 01/07/2020.   Since consultation, she underwent breast MRI on 01/21/2020 showing: breast composition D; biopsy-proven malignancy in upper-outer right breast; two additional suspicious masses in central right breast, measuring 12 and 6 mm; two prominent low-lying right axillary lymph nodes; no suspicious left breast findings.  She underwent right breast ultrasound on 01/28/2020 showing: no sonographic correlate for the two central right breast masses seen on MRI; no right axillary adenopathy, previously demonstrated 2 lymph nodes have normal appearances.  She underwent biopsy of the central right breast on 01/30/2020. Pathology [id number]) showed: invasive mammary carcinoma, grade 2, e-cadherin positive. Prognostic indicators significant for: estrogen receptor 100% positive with strong staining intensity; progesterone receptor 60% positive with moderate-weak staining intensity.  Proliferation marker Ki67 of 1%. Her2 negative by immunohistochemistry (0).  She opted to proceed with right lumpectomy on 02/19/2020 under Dr. Marlou Starks. 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  The two biopsied lymph nodes were both negative for carcinoma (0/2).  Oncotype DX was obtained on the final surgical sample and the recurrence score of 5 predicts a risk of recurrence outside the breast over the next 9 years of 3%, if the patient's only systemic therapy is an antiestrogen for 5 years.  It also predicts no benefit from chemotherapy.   She was referred back to Dr. Lisbeth Renshaw on 5/20/202 to discuss radiation therapy. She began treatment on 03/23/2020 and is scheduled to finish on 04/19/2020.   REVIEW OF SYSTEMS: [name] is tolerating adjuvant radiation well.  She just went on an 8 mile hike in [place] which she greatly enjoyed.  She is ready to start considering antiestrogens.  A detailed review of systems today was benign except as noted   HISTORY OF CURRENT ILLNESS: From the original intake note:  Kara Kim had routine screening mammography showing a possible abnormality in the right breast. She underwent right diagnostic mammography with tomography at [facility] on 12/24/2019 showing: breast density category D; 0.6 cm grouped calcifications in the upper-outer right breast.  Accordingly on 12/26/2019 she proceeded to biopsy of the right breast area in question. 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 Prognostic indicators significant for: estrogen receptor, 100% positive and progesterone receptor, 100% positive, both with strong staining intensity. Proliferation marker Ki67 at 5%. HER2 negative by immunohistochemistry  (1+).  The patient's subsequent history is as detailed below   PAST MEDICAL HISTORY: Past Medical History:  Diagnosis  Date  . Breast cancer ([place])   . Complication of anesthesia   . Heart murmur   . Mitral valve disorders(424.0) 07/29/2013  . PONV (postoperative nausea and vomiting)   . Seasonal allergies   . Thyroid disease   Persistently elevated CRP   PAST SURGICAL HISTORY: Past Surgical History:  Procedure Laterality Date  . ANAL FISSURE REPAIR    . BREAST LUMPECTOMY WITH RADIOACTIVE SEED AND SENTINEL LYMPH NODE BIOPSY Right 02/19/2020   Procedure: RIGHT BREAST LUMPECTOMY X 2  WITH RADIOACTIVE SEED AND SENTINEL LYMPH NODE BIOPSY;  Surgeon: [name], MD;  Location: [facility];  Service: General;  Laterality: Right;  . CESAREAN SECTION    . eye lid lift    . tummy tuck    Anal fissure repair   FAMILY HISTORY: Family History  Problem Relation Age of Onset  . Lung cancer Mother   . Hypertension Father   . Hypertension Sister   . Seizures Son    She reports lung cancer in her mother, who was a smoker. She died age 81. The patient's father died age 81 from a ruptured aneurysm. His mother had cancer but the patient does not know much about it except "it was not an inherited type". The patient has 2 sisters, no brothers. One sister, Kara Kim, is an Materials engineer in [place].   GYNECOLOGIC HISTORY:  No LMP recorded. Patient is postmenopausal. Menarche: 59 years old Age at first live birth: 59 years old [place] P 2 LMP mid-50's HRT yes, about a decade, ending at the time of breast cancer diagnosis (MAR 2021) Hysterectomy? no BSO? no   SOCIAL HISTORY: (updated 12/2019)  Dorianna worked in [employer] fund[employer], but is now retired. At home it's just she and her husband Kara Kim, who does custom woodworking. Their 2 sons are Kara Kim, 74 and Kara Kim 20, both studying art. The patient attends [employer] DIRECTIVES: in the absence of any document to the contrary the  patient's husband is her HCPOA   HEALTH MAINTENANCE: Social History   Tobacco Use  . Smoking status: Never Smoker  . Smokeless tobacco: Never Used  Substance Use Topics  . Alcohol use: Yes    Alcohol/week: 4.0 standard drinks    Types: 4 Glasses of wine per week    Comment: occasional  . Drug use: No     Colonoscopy: 2012/ [facility]  PAP: UTD/ [name]  Bone density:    Allergies  Allergen Reactions  . Eggs Or Egg-Derived Products Swelling  . Sulfur Other (See Comments)    [name] Syndrome  . Sulfamethoxazole Rash    Current Outpatient Medications  Medication Sig Dispense Refill  . Cholecalciferol (VITAMIN D-3) 125 MCG (5000 UT) TABS Take 5,000 Units by mouth daily.     . fexofenadine (ALLEGRA) 180 MG tablet Take 180 mg by mouth daily.    . fluticasone (FLONASE) 50 MCG/ACT nasal spray Place 2 sprays into both nostrils daily.    . Multiple Vitamin (MULTIVITAMIN) tablet Take 1 tablet by mouth daily.    . Probiotic Product (PROBIOTIC DAILY PO) Take 1 tablet by mouth daily.    . tamoxifen (NOLVADEX) 20 MG tablet Take 1 tablet (20 mg total) by mouth daily. Start 05/23/2020 90 tablet 4  . thyroid (ARMOUR) 60 MG tablet Take 60 mg by mouth daily before breakfast.     No current facility-administered medications for this visit.    OBJECTIVE:  White woman who appears younger than stated age  Vitals:   04/13/20 0950  BP: 132/64  Pulse: (!) 56  Resp: 20  Temp: 98 F (36.7 C)  SpO2: 100%     Body mass index is 21.61 kg/m.   Wt Readings from Last 3 Encounters:  04/13/20 122 lb (55.3 kg)  03/11/20 124 lb (56.2 kg)  02/19/20 120 lb (54.4 kg)      ECOG FS:1 - Symptomatic but completely ambulatory  Sclerae unicteric, EOMs intact Wearing a mask No cervical or supraclavicular adenopathy Lungs no rales or rhonchi Heart regular rate and rhythm Abd soft, nontender, positive bowel sounds MSK no focal spinal tenderness, no upper extremity lymphedema Neuro: nonfocal, well  oriented, appropriate affect Breasts: The right breast is status post lumpectomy and is currently receiving radiation.  The cosmetic result is good.  There is no evidence of residual or recurrent disease.  The left breast is benign.  Both axillae are benign.   LAB RESULTS:  CMP     Component Value Date/Time   NA 140 02/12/2020 1200   K 4.1 02/12/2020 1200   CL 102 02/12/2020 1200   CO2 29 02/12/2020 1200   GLUCOSE 83 02/12/2020 1200   BUN 21 (H) 02/12/2020 1200   CREATININE 0.91 02/12/2020 1200   CREATININE 1.04 (H) 01/07/2020 1535   CALCIUM 9.5 02/12/2020 1200   PROT 6.4 (L) 01/07/2020 1535   ALBUMIN 3.9 01/07/2020 1535   AST 23 01/07/2020 1535   ALT 16 01/07/2020 1535   ALKPHOS 62 01/07/2020 1535   BILITOT 0.6 01/07/2020 1535   GFRNONAA >60 02/12/2020 1200   GFRNONAA 59 (L) 01/07/2020 1535   GFRAA >60 02/12/2020 1200   GFRAA >60 01/07/2020 1535    No results found for: TOTALPROTELP, ALBUMINELP, A1GS, A2GS, BETS, BETA2SER, GAMS, MSPIKE, SPEI  Lab Results  Component Value Date   WBC 4.5 02/12/2020   NEUTROABS 2.4 01/07/2020   HGB 14.3 02/12/2020   HCT 43.4 02/12/2020   MCV 99.3 02/12/2020   PLT 231 02/12/2020    No results found for: LABCA2  No components found for: NTZGYF749  No results for input(s): INR in the last 168 hours.  No results found for: LABCA2  No results found for: SWH675  No results found for: FFM384  No results found for: YKZ993  No results found for: CA2729  No components found for: HGQUANT  No results found for: CEA1 / No results found for: CEA1   No results found for: AFPTUMOR  No results found for: CHROMOGRNA  No results found for: KPAFRELGTCHN, LAMBDASER, KAPLAMBRATIO (kappa/lambda light chains)  No results found for: HGBA, HGBA2QUANT, HGBFQUANT, HGBSQUAN (Hemoglobinopathy evaluation)   No results found for: LDH  No results found for: IRON, TIBC, IRONPCTSAT (Iron and TIBC)  No results found for:  FERRITIN  Urinalysis No results found for: COLORURINE, APPEARANCEUR, LABSPEC, PHURINE, GLUCOSEU, HGBUR, BILIRUBINUR, KETONESUR, PROTEINUR, UROBILINOGEN, NITRITE, LEUKOCYTESUR   STUDIES: No results found.   ELIGIBLE FOR AVAILABLE RESEARCH PROTOCOL: AET  ASSESSMENT: 59 y.o. [place] woman status post right breast upper outer quadrant biopsy 12/26/2019 for a clinical T1b NX, stage IA invasive ductal carcinoma, grade 2, estrogen and progesterone receptor strongly positive, HER-2 not amplified, with an MIB-1 of 5%  (a) MRI pending for further evaluation of category D breast density showed 2 additional areas of concern in the central right breast and 2 prominent low-lying right axillary lymph nodes.  The left was unremarkable  (b) biopsy of 1 additional area in the right breast on 01/30/2020 showed invasive ductal carcinoma,  E-cadherin positive, grade 2, estrogen and progesterone receptor positive, with an MIB-1 of 1% and no HER-2 amplification  (1) right lumpectomy and sentinel lymph node sampling 02/19/2020 showed a pT1b pNo, stage IA invasive ductal carcinoma, grade 2, with close but negative margins.  (a) a total of 2 axillary lymph nodes were removed  (2) Oncotype score of 5 predicts a risk of recurrence outside the breast over the next 9 years of 3% if the patient's only systemic therapy is antiestrogens for 5 years.  It also predicts no benefit from adjuvant chemotherapy.  (3) adjuvant radiation to be completed 04/19/2020  (4) to start tamoxifen 05/23/2020   PLAN: Kamden did remarkably well with her surgery and is tolerating radiation well also.  She will be completing her local treatment next week and will then be ready to start systemic therapy.  We reviewed results of her Oncotype which are extremely favorable.  They show that she has had very low risk of disease outside the breast assuming she takes an antiestrogen for 5 years.  It also shows she would get no benefit from  chemotherapy.  _0 @   has completed her local treatment and is now ready to start anti-estrogens.  We discussed the difference between tamoxifen and anastrozole in detail. She understands that anastrozole and the aromatase inhibitors in general work by blocking estrogen production. Accordingly vaginal dryness, decrease in bone density, and of course hot flashes can result. The aromatase inhibitors can also negatively affect the cholesterol profile, although that is a minor effect. One out of 5 women on aromatase inhibitors we will feel "old and achy". This arthralgia/myalgia syndrome, which resembles fibromyalgia clinically, does resolve with stopping the medications. Accordingly this is not a reason to not try an aromatase inhibitor but it is a frequent reason to stop it (in other words 20% of women will not be able to tolerate these medications).  Tamoxifen on the other hand does not block estrogen production. It does not "take away a woman's estrogen". It blocks the estrogen receptor in breast cells. Like anastrozole, it can also cause hot flashes. As opposed to anastrozole, tamoxifen has many estrogen-like effects. It is technically an estrogen receptor modulator. This means that in some tissues tamoxifen works like estrogen-- for example it helps strengthen the bones. It tends to improve the cholesterol profile. It can cause thickening of the endometrial lining, and even endometrial polyps or rarely cancer of the uterus.(The risk of uterine cancer due to tamoxifen is one additional cancer per thousand women year). It can cause vaginal wetness or stickiness. It can cause blood clots through this estrogen-like effect--the risk of blood clots with tamoxifen is exactly the same as with birth control pills or hormone replacement.  Neither of these agents causes mood changes or weight gain, despite the popular belief that they can have these side effects. We have data from studies comparing  either of these drugs with placebo, and in those cases the control group had the same amount of weight gain and depression as the group that took the drug.  After taking all this into account we thought starting with tamoxifen would be a good choice for her.  She will wait a little bit until after radiation and a trip that she has planned so she will actually start 05/23/2020.  I am going to make a virtual appointment for her with me in October just to make sure she is tolerating the tamoxifen well.  If so we will initiate [facility]-term follow-up  at that time  She knows to call for any other issue that may develop before the next visit.   Virgie Dad. Magrinat, MD 04/13/2020 4:03 PM [facility] [place], [place] [address] Tel. [phone]    Fax. [phone]   This document serves as a record of services personally performed by Lurline Del, MD. It was created on his behalf by Wilburn Mylar, a trained medical scribe. The creation of this record is based on the scribe's personal observations and the provider's statements to them.   I, Lurline Del MD, have reviewed the above documentation for accuracy and completeness, and I agree with the above.   *Total Encounter Time as defined by the Centers for Medicare and Medicaid Services includes, in addition to the face-to-face time of a patient visit (documented in the note above) non-face-to-face time: obtaining and reviewing outside history, ordering and reviewing medications, tests or procedures, care coordination (communications with other health care professionals or caregivers) and documentation in the medical record.

## 2020-04-13 ENCOUNTER — Other Ambulatory Visit: Payer: Self-pay

## 2020-04-13 ENCOUNTER — Ambulatory Visit
Admission: RE | Admit: 2020-04-13 | Discharge: 2020-04-13 | Disposition: A | Discharge status: Home / Self Care | Source: Ambulatory Visit | Attending: Radiation Oncology | Admitting: Radiation Oncology

## 2020-04-13 ENCOUNTER — Inpatient Hospital Stay: Attending: Oncology | Admitting: Oncology

## 2020-04-13 VITALS — BP 132/64 | HR 56 | Temp 98.0°F | Resp 20 | Ht 63.0 in | Wt 122.0 lb

## 2020-04-13 DIAGNOSIS — Z170 Estrogen receptor positive status [ER+]: Secondary | ICD-10-CM | POA: Diagnosis not present

## 2020-04-13 DIAGNOSIS — Z510 Encounter for antineoplastic radiation therapy: Secondary | ICD-10-CM | POA: Diagnosis not present

## 2020-04-13 DIAGNOSIS — C50411 Malignant neoplasm of upper-outer quadrant of right female breast: Secondary | ICD-10-CM | POA: Insufficient documentation

## 2020-04-13 MED ORDER — TAMOXIFEN CITRATE 20 MG PO TABS
20.0000 mg | ORAL_TABLET | Freq: Every day | ORAL | 4 refills | Status: DC
Start: 2020-04-13 — End: 2020-04-15

## 2020-04-14 ENCOUNTER — Other Ambulatory Visit: Payer: Self-pay

## 2020-04-14 ENCOUNTER — Ambulatory Visit
Admission: RE | Admit: 2020-04-14 | Discharge: 2020-04-14 | Disposition: A | Discharge status: Home / Self Care | Source: Ambulatory Visit | Attending: Radiation Oncology | Admitting: Radiation Oncology

## 2020-04-14 DIAGNOSIS — Z510 Encounter for antineoplastic radiation therapy: Secondary | ICD-10-CM | POA: Diagnosis not present

## 2020-04-15 ENCOUNTER — Other Ambulatory Visit: Payer: Self-pay | Admitting: *Deleted

## 2020-04-15 ENCOUNTER — Ambulatory Visit: Admission: RE | Admit: 2020-04-15 | Source: Ambulatory Visit

## 2020-04-15 ENCOUNTER — Ambulatory Visit
Admission: RE | Admit: 2020-04-15 | Discharge: 2020-04-15 | Disposition: A | Discharge status: Home / Self Care | Source: Ambulatory Visit | Attending: Radiation Oncology | Admitting: Radiation Oncology

## 2020-04-15 ENCOUNTER — Ambulatory Visit

## 2020-04-15 ENCOUNTER — Other Ambulatory Visit: Payer: Self-pay

## 2020-04-15 DIAGNOSIS — Z510 Encounter for antineoplastic radiation therapy: Secondary | ICD-10-CM | POA: Diagnosis not present

## 2020-04-15 MED ORDER — TAMOXIFEN CITRATE 20 MG PO TABS: 20.0000 mg | ORAL_TABLET | Freq: Every day | ORAL | 4 refills | Status: AC

## 2020-04-15 NOTE — Telephone Encounter (Signed)
VM left by the patient stating need for her prescription to be sent to [facility] for the tamoxifen.  Prescription sent per above.  Call returned to the pt - obtained verified VM- message left per above information.

## 2020-04-16 ENCOUNTER — Ambulatory Visit
Admission: RE | Admit: 2020-04-16 | Discharge: 2020-04-16 | Disposition: A | Discharge status: Home / Self Care | Source: Ambulatory Visit | Attending: Radiation Oncology | Admitting: Radiation Oncology

## 2020-04-16 ENCOUNTER — Other Ambulatory Visit: Payer: Self-pay

## 2020-04-16 DIAGNOSIS — Z510 Encounter for antineoplastic radiation therapy: Secondary | ICD-10-CM | POA: Diagnosis not present

## 2020-04-19 ENCOUNTER — Encounter: Payer: Self-pay | Admitting: Radiation Oncology

## 2020-04-19 ENCOUNTER — Other Ambulatory Visit: Payer: Self-pay

## 2020-04-19 ENCOUNTER — Encounter: Payer: Self-pay | Admitting: *Deleted

## 2020-04-19 ENCOUNTER — Ambulatory Visit
Admission: RE | Admit: 2020-04-19 | Discharge: 2020-04-19 | Disposition: A | Discharge status: Home / Self Care | Source: Ambulatory Visit | Attending: Radiation Oncology | Admitting: Radiation Oncology

## 2020-04-19 DIAGNOSIS — Z510 Encounter for antineoplastic radiation therapy: Secondary | ICD-10-CM | POA: Diagnosis not present

## 2020-05-17 ENCOUNTER — Ambulatory Visit: Admitting: Oncology

## 2020-05-20 ENCOUNTER — Telehealth: Payer: Self-pay | Admitting: Radiation Oncology

## 2020-05-20 NOTE — Telephone Encounter (Signed)
  Radiation Oncology         [phone]) [phone] ________________________________  Name: Kara Kim MRN: [id number]  Date of Service: 05/20/2020  DOB: December 09, 1960  Post Treatment Telephone Note  Diagnosis:   Stage IA, cT1bN0M0 grade 2, ER/PR positive invasive ductal carcinoma of the right breast  Interval Since Last Radiation:  4 weeks   03/23/20-04/19/20:  The right breast was treated to 42.56 in 16 fractions followed by an 10 Gy boost in 4 fractions.  Narrative:  The patient was contacted today for routine follow-up. During treatment she did very well with radiotherapy and did not have significant desquamation. She reports she is doing well and her skin is back to normal appearance.  Impression/Plan: 1. Stage IA, cT1bN0M0 grade 2, ER/PR positive invasive ductal carcinoma of the right breast. The patient has been doing well since completion of radiotherapy. We discussed that we would be happy to continue to follow her as needed, but she will also continue to follow up with Dr. Jana Hakim in medical oncology. She was counseled on skin care as well as measures to avoid sun exposure to this area.  2. Survivorship. We discussed the importance of survivorship evaluation and encouraged her to attend her upcoming visit with that clinic.    Carola Rhine, PAC

## 2020-05-24 ENCOUNTER — Ambulatory Visit: Admitting: Oncology

## 2020-05-28 NOTE — Progress Notes (Signed)
  Radiation Oncology         [phone] ________________________________  Name: Kara Kim MRN: [id number]  Date: 04/19/2020  DOB: [birth year]  End of Treatment Note  Diagnosis:   right-sided breast cancer     Indication for treatment:  Curative       Radiation treatment dates:   03/23/20 - 04/19/20  Site/dose:   The patient initially received a dose of 42.56 Gy in 16 fractions to the breast using whole-breast tangent fields. This was delivered using a 3-D conformal technique. The patient then received a boost to the seroma. This delivered an additional 8 Gy in 44fractions using a 3 field photon technique due to the depth of the seroma. The total dose was 50.56 Gy.  Narrative: The patient tolerated radiation treatment relatively well.   The patient had some expected skin irritation as she progressed during treatment. Moist desquamation was not present at the end of treatment.  Plan: The patient has completed radiation treatment. The patient will return to radiation oncology clinic for routine followup in one month. I advised the patient to call or return sooner if they have any questions or concerns related to their recovery or treatment. ________________________________  Jodelle Gross, M.D., Ph.D.

## 2020-07-16 ENCOUNTER — Other Ambulatory Visit: Payer: Self-pay | Admitting: General Surgery

## 2020-07-16 DIAGNOSIS — N6310 Unspecified lump in the right breast, unspecified quadrant: Secondary | ICD-10-CM

## 2020-08-09 ENCOUNTER — Telehealth: Admitting: Oncology

## 2020-08-12 ENCOUNTER — Inpatient Hospital Stay: Attending: Oncology | Admitting: Oncology

## 2020-08-12 DIAGNOSIS — C50411 Malignant neoplasm of upper-outer quadrant of right female breast: Secondary | ICD-10-CM | POA: Diagnosis not present

## 2020-08-12 DIAGNOSIS — Z170 Estrogen receptor positive status [ER+]: Secondary | ICD-10-CM | POA: Diagnosis not present

## 2020-08-12 DIAGNOSIS — Z923 Personal history of irradiation: Secondary | ICD-10-CM | POA: Insufficient documentation

## 2020-08-12 DIAGNOSIS — Z79810 Long term (current) use of selective estrogen receptor modulators (SERMs): Secondary | ICD-10-CM | POA: Insufficient documentation

## 2020-08-12 DIAGNOSIS — Z79899 Other long term (current) drug therapy: Secondary | ICD-10-CM | POA: Insufficient documentation

## 2020-08-12 MED ORDER — ALPRAZOLAM 0.5 MG PO TABS
0.5000 mg | ORAL_TABLET | Freq: Every evening | ORAL | 0 refills | Status: DC | PRN
Start: 2020-08-12 — End: 2021-01-31

## 2020-08-12 NOTE — Progress Notes (Signed)
[place]  [phone] [phone]     ID: Kara Kim DOB: March 29, 1961  MR#: [id number]  BSJ#:[id number]  Patient Care Team: Patient, No Pcp Per as PCP - General (General Practice) Kara Kussmaul, MD as Consulting Physician (General Surgery) Kara Kim, Kara Dad, MD as Consulting Physician (Oncology) Kara Rudd, MD as Consulting Physician (Radiation Oncology) Kara Silence, MD as Consulting Physician (Gastroenterology) Kara Pigg, MD as Consulting Physician (Dermatology) Kara Gula, MD (Family Medicine) Kara Pearson, MD as Consulting Physician (Obstetrics and Gynecology) Kara Kaufmann, RN as Oncology Nurse Navigator Kara Germany, RN as Oncology Nurse Navigator Kara Cruel, MD OTHER MD:  I connected with Kara Kim on 08/12/20 at  2:00 PM EDT by video enabled telemedicine visit and verified that I am speaking with the correct person using two identifiers.   I discussed the limitations, risks, security and privacy concerns of performing an evaluation and management service by telemedicine and the availability of in-person appointments. I also discussed with the patient that there may be a patient responsible charge related to this service. The patient expressed understanding and agreed to proceed.   Other persons participating in the visit and their role in the encounter: none  Patient's location: home Provider's location: [place]: estrogen receptor positive breast cancer  CURRENT TREATMENT: tamoxifen   INTERVAL HISTORY: Kara Kim was contacted today for follow up of her estrogen receptor positive breast cancer.   Since her last visit, she completed radiation therapy on 04/19/2020.  She started tamoxifen on 05/23/2020.  She is tolerating this remarkably well, with no hot flashes and very minimal vaginal discharge which is clear not smelly and not itchy.  She is scheduled  for breast MRI on 08/23/2020.   REVIEW OF SYSTEMS: Kara Kim wonders if tamoxifen can cause weight gain.  She is also on multiples supplements and she wonders if it is safe to continue those.  She feels a little tired but then she walks between 4 and 6 miles most days.  She also does her housework and yard work.  She tells me she gets extremely anxious before procedures and she has an MRI coming up.  Aside from these issues a detailed review of systems today was stable.   HISTORY OF CURRENT ILLNESS: From the original intake note:  Kara Kim had routine screening mammography showing a possible abnormality in the right breast. She underwent right diagnostic mammography with tomography at [facility] on 12/24/2019 showing: breast density category D; 0.6 cm grouped calcifications in the upper-outer right breast.  Accordingly on 12/26/2019 she proceeded to biopsy of the right breast area in question. 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 Prognostic indicators significant for: estrogen receptor, 100% positive and progesterone receptor, 100% positive, both with strong staining intensity. Proliferation marker Ki67 at 5%. HER2 negative by immunohistochemistry (1+).  The patient's subsequent history is as detailed below   PAST MEDICAL HISTORY: Past Medical History:  Diagnosis Date  . Breast cancer ([place])   . Complication of anesthesia   . Heart murmur   . Mitral valve disorders(424.0) 07/29/2013  . PONV (postoperative nausea and vomiting)   . Seasonal allergies   . Thyroid disease   Persistently elevated CRP   PAST SURGICAL HISTORY: Past Surgical History:  Procedure Laterality Date  . ANAL FISSURE REPAIR    . BREAST LUMPECTOMY  WITH RADIOACTIVE SEED AND SENTINEL LYMPH NODE BIOPSY Right 02/19/2020   Procedure: RIGHT BREAST LUMPECTOMY X 2  WITH RADIOACTIVE SEED AND  SENTINEL LYMPH NODE BIOPSY;  Surgeon: Kara Kussmaul, MD;  Location: [facility];  Service: General;  Laterality: Right;  . CESAREAN SECTION    . eye lid lift    . tummy tuck    Anal fissure repair   FAMILY HISTORY: Family History  Problem Relation Age of Onset  . Lung cancer Mother   . Hypertension Father   . Hypertension Sister   . Seizures Son    She reports lung cancer in her mother, who was a smoker. She died age 75. The patient's father died age 64 from a ruptured aneurysm. His mother had cancer but the patient does not know much about it except "it was not an inherited type". The patient has 2 sisters, no brothers. One sister, Kara Kim, is an Materials engineer in [place].   GYNECOLOGIC HISTORY:  No LMP recorded. Patient is postmenopausal. Menarche: 59 years old Age at first live birth: 59 years old Kara Kim P 2 LMP mid-50's HRT yes, about a decade, ending at the time of breast cancer diagnosis (MAR 2021) Hysterectomy? no BSO? 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 At home it's just she and her husband Kara Kim, who does custom woodworking. Their 2 sons are Kara Kim, 50 and Kara Kim 20, both studying art. The patient attends [name] DIRECTIVES: in the absence of any document to the contrary the patient's husband is her HCPOA   HEALTH MAINTENANCE: Social History   Tobacco Use  . Smoking status: Never Smoker  . Smokeless tobacco: Never Used  Substance Use Topics  . Alcohol use: Yes    Alcohol/week: 4.0 standard drinks    Types: 4 Glasses of wine per week    Comment: occasional  . Drug use: No     Colonoscopy: 2012/ [facility]  PAP: UTD/ [name]  Bone density:    Allergies  Allergen Reactions  . Eggs Or Egg-Derived Products Swelling  . Sulfur Other (See Comments)    [name] Syndrome  . Sulfamethoxazole Rash    Current Outpatient Medications  Medication Sig Dispense Refill  . ALPRAZolam  (XANAX) 0.5 MG tablet Take 1 tablet (0.5 mg total) by mouth at bedtime as needed for anxiety. 5 tablet 0  . Cholecalciferol (VITAMIN D-3) 125 MCG (5000 UT) TABS Take 5,000 Units by mouth daily.     . fexofenadine (ALLEGRA) 180 MG tablet Take 180 mg by mouth daily.    . fluticasone (FLONASE) 50 MCG/ACT nasal spray Place 2 sprays into both nostrils daily.    . Probiotic Product (PROBIOTIC DAILY PO) Take 1 tablet by mouth daily.    Marland Kitchen thyroid (ARMOUR) 60 MG tablet Take 60 mg by mouth daily before breakfast.     No current facility-administered medications for this visit.    OBJECTIVE:  White woman There were no vitals filed for this visit.   There is no height or weight on file to calculate BMI.   Wt Readings from Last 3 Encounters:  04/13/20 122 lb (55.3 kg)  03/11/20 124 lb (56.2 kg)  02/19/20 120 lb (54.4 kg)      ECOG FS:1 - Symptomatic but completely ambulatory  Telemedicine visit 08/12/2020  LAB RESULTS:  CMP     Component Value Date/Time   NA 140 02/12/2020 1200   K 4.1  02/12/2020 1200   CL 102 02/12/2020 1200   CO2 29 02/12/2020 1200   GLUCOSE 83 02/12/2020 1200   BUN 21 (H) 02/12/2020 1200   CREATININE 0.91 02/12/2020 1200   CREATININE 1.04 (H) 01/07/2020 1535   CALCIUM 9.5 02/12/2020 1200   PROT 6.4 (L) 01/07/2020 1535   ALBUMIN 3.9 01/07/2020 1535   AST 23 01/07/2020 1535   ALT 16 01/07/2020 1535   ALKPHOS 62 01/07/2020 1535   BILITOT 0.6 01/07/2020 1535   GFRNONAA >60 02/12/2020 1200   GFRNONAA 59 (L) 01/07/2020 1535   GFRAA >60 02/12/2020 1200   GFRAA >60 01/07/2020 1535    No results found for: TOTALPROTELP, ALBUMINELP, A1GS, A2GS, BETS, BETA2SER, GAMS, MSPIKE, SPEI  Lab Results  Component Value Date   WBC 4.5 02/12/2020   NEUTROABS 2.4 01/07/2020   HGB 14.3 02/12/2020   HCT 43.4 02/12/2020   MCV 99.3 02/12/2020   PLT 231 02/12/2020    No results found for: LABCA2  No components found for: YEBXID568  No results for input(s): INR in the last  168 hours.  No results found for: LABCA2  No results found for: SHU837  No results found for: GBM211  No results found for: DBZ208  No results found for: CA2729  No components found for: HGQUANT  No results found for: CEA1 / No results found for: CEA1   No results found for: AFPTUMOR  No results found for: CHROMOGRNA  No results found for: KPAFRELGTCHN, LAMBDASER, KAPLAMBRATIO (kappa/lambda light chains)  No results found for: HGBA, HGBA2QUANT, HGBFQUANT, HGBSQUAN (Hemoglobinopathy evaluation)   No results found for: LDH  No results found for: IRON, TIBC, IRONPCTSAT (Iron and TIBC)  No results found for: FERRITIN  Urinalysis No results found for: COLORURINE, APPEARANCEUR, LABSPEC, PHURINE, GLUCOSEU, HGBUR, BILIRUBINUR, KETONESUR, PROTEINUR, UROBILINOGEN, NITRITE, LEUKOCYTESUR   STUDIES: No results found.   ELIGIBLE FOR AVAILABLE RESEARCH PROTOCOL: AET  ASSESSMENT: 59 y.o.  woman status post right breast upper outer quadrant biopsy 12/26/2019 for a clinical T1b NX, stage IA invasive ductal carcinoma, grade 2, estrogen and progesterone receptor strongly positive, HER-2 not amplified, with an MIB-1 of 5%  (a) MRI obtained for further evaluation of category D breast density showed 2 additional areas of concern in the central right breast and 2 prominent low-lying right axillary lymph nodes.  The left was unremarkable  (b) biopsy of 1 additional area in the right breast on 01/30/2020 showed invasive ductal carcinoma, E-cadherin positive, grade 2, estrogen and progesterone receptor positive, with an MIB-1 of 1% and no HER-2 amplification  (1) right lumpectomy and sentinel lymph node sampling 02/19/2020 showed a pT1b pNo, stage IA invasive ductal carcinoma, grade 2, with close but negative margins.  (a) a total of 2 axillary lymph nodes were removed  (2) Oncotype score of 5 predicts a risk of recurrence outside the breast over the next 9 years of 3% if the  patient's only systemic therapy is antiestrogens for 5 years.  It also predicts no benefit from adjuvant chemotherapy.  (3) adjuvant radiation to be completed 04/19/2020  (4) started tamoxifen 05/23/2020   Needs MRI yearly until loweer density  PLAN: Dayzha is tolerating tamoxifen quite well.  I reassured her that we have placebo-controlled data that tamoxifen does not cause weight gain but weight gain is certainly common between the ages of 89 and 61.  We reviewed all her supplements and I do not know of any obvious contraindication between any of them and tamoxifen  She tells me  she gets exceedingly anxious before procedures and she has a breast MRI coming up.  I wrote her for Xanax 0.5 mg to take before the procedure  Otherwise she will have her next mammography in May and see me shortly after that visit.  From that point we will start doing yearly follow-ups.  Kara Kim. Willo Yoon, MD 08/12/2020 5:26 PM Medical Oncology and Hematology [facility] [place], [place] [address] Tel. [phone]    Fax. [phone]   This document serves as a record of services personally performed by Lurline Del, MD. It was created on his behalf by Wilburn Mylar, a trained medical scribe. The creation of this record is based on the scribe's personal observations and the provider's statements to them.   I, Lurline Del MD, have reviewed the above documentation for accuracy and completeness, and I agree with the above.   *Total Encounter Time as defined by the Centers for Medicare and Medicaid Services includes, in addition to the face-to-face time of a patient visit (documented in the note above) non-face-to-face time: obtaining and reviewing outside history, ordering and reviewing medications, tests or procedures, care coordination (communications with other health care professionals or caregivers) and documentation in the medical record.

## 2020-08-16 ENCOUNTER — Telehealth: Payer: Self-pay | Admitting: Gastroenterology

## 2020-08-16 NOTE — Telephone Encounter (Signed)
Pt called stating that she is having his last colon records from 9 years ago with Dr. Paulita Fujita faxed over. She is requesting Dr. Tarri Glenn to review them as Dr. Paulita Fujita is not longer in network with her insurance.

## 2020-08-19 ENCOUNTER — Ambulatory Visit: Attending: General Surgery

## 2020-08-19 ENCOUNTER — Other Ambulatory Visit: Payer: Self-pay

## 2020-08-19 DIAGNOSIS — M6281 Muscle weakness (generalized): Secondary | ICD-10-CM | POA: Diagnosis present

## 2020-08-19 DIAGNOSIS — M25611 Stiffness of right shoulder, not elsewhere classified: Secondary | ICD-10-CM | POA: Diagnosis present

## 2020-08-19 DIAGNOSIS — Z483 Aftercare following surgery for neoplasm: Secondary | ICD-10-CM | POA: Diagnosis present

## 2020-08-19 NOTE — Patient Instructions (Signed)
Patient was instructed today in a home exercise program today for post op shoulder range of motion. These included active assist shoulder flexion in sitting, scapular retraction, wall walking with shoulder abduction, and hands behind head external rotation.  She was encouraged to do these twice a day, holding 3 seconds and repeating 5 times when permitted by her physician. She was also educated in doing these in supine for shoulder flexion and hands behind head. Pt was also instructed in scar massage to loosen her incision It is recommended that this patient attend the After Breast Cancer Class at [facility].  This is a free one time class held the 1st and 3rd Monday of each month from 11-12.  The patient will learn further lymphedema risk reduction techniques and additional exercises for regaining shoulder mobility, posture, and strength.  She is encouraged to contact the [facility] at [phone] when she is ready to attend the class.  She is currently scheduled for this class on Nov. 15

## 2020-08-19 NOTE — Therapy (Signed)
[place], [place], [address] Phone: [phone]   Fax:  [phone]  Physical Therapy Evaluation  Patient Details  Name: Kara Kim MRN: [id number] Date of Birth: May 10, 1961 Referring Provider (PT): Dr.Toth   Encounter Date: 08/19/2020   PT End of Session - 08/19/20 1230    Visit Number 1    Number of Visits 13    Date for PT Re-Evaluation 09/30/20    PT Start Time 6283    PT Stop Time 1150    PT Time Calculation (min) 45 min           Past Medical History:  Diagnosis Date  . Breast cancer ([place])   . Complication of anesthesia   . Heart murmur   . Mitral valve disorders(424.0) 07/29/2013  . PONV (postoperative nausea and vomiting)   . Seasonal allergies   . Thyroid disease     Past Surgical History:  Procedure Laterality Date  . ANAL FISSURE REPAIR    . BREAST LUMPECTOMY WITH RADIOACTIVE SEED AND SENTINEL LYMPH NODE BIOPSY Right 02/19/2020   Procedure: RIGHT BREAST LUMPECTOMY X 2  WITH RADIOACTIVE SEED AND SENTINEL LYMPH NODE BIOPSY;  Surgeon: [name], MD;  Location: [facility];  Service: General;  Laterality: Right;  . CESAREAN SECTION    . eye lid lift    . tummy tuck      There were no vitals filed for this visit.    Subjective Assessment - 08/19/20 1106    Subjective Had right breast sx 02/19/20.  Feeling a lot of tightness in shoulder and can't go all the way back with right arm.  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 No complaints of swelling or breast pain.  She did not go through the multidisciplinary breast clinic and did not do a SOZO screen    Pertinent History Pt was diagnosed with malignanat neoplasm of upper outer quadrant of right breast and underwent Right breast Lumpectomy with radioactive seed and SLNB on 02/19/20.  She had 1 month of radiation and is presently on tamoxifen.  She did not require chemotherapy.  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      Patient Stated Goals Improve shoulder ROM/decrease tightness    Currently in Pain? No/denies              [facility] PT Assessment - 08/19/20 0001      Assessment   Referring Provider (PT) Dr.Toth    Onset Date/Surgical Date 02/19/20    Hand Dominance Right    Prior Therapy left shoulder      Precautions   Precaution Comments breast sx 02/19/20      Balance Screen   Has the patient fallen in the past 6 months Yes    How many times? 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 Cognitive Status Within Functional Limits for tasks assessed      Posture/Postural Control   Posture/Postural Control Postural limitations    Postural Limitations Rounded Shoulders      ROM / Strength   AROM / PROM / Strength AROM;PROM      AROM   Right Shoulder Extension 40 Degrees    Right Shoulder Flexion 125 Degrees  Right Shoulder ABduction 110 Degrees    Right Shoulder Internal Rotation 50 Degrees    Right Shoulder External Rotation 90 Degrees    Left Shoulder Extension 59 Degrees    Left Shoulder Flexion 160 Degrees    Left Shoulder ABduction 170 Degrees    Left Shoulder Internal Rotation 72 Degrees    Left Shoulder External Rotation 84 Degrees      PROM   Right Shoulder Flexion 143 Degrees    Right Shoulder ABduction 150 Degrees      Palpation   Palpation comment tender medial upper arm / Deep cord             LYMPHEDEMA/ONCOLOGY QUESTIONNAIRE - 08/19/20 0001      Surgeries   Lumpectomy Date 02/19/20    Sentinel Lymph Node Biopsy Date 02/19/20    Number Lymph Nodes Removed 2      Treatment   Active Chemotherapy Treatment No    Past Chemotherapy Treatment No    Active Radiation Treatment No    Past Radiation Treatment Yes    Current Hormone Treatment Yes    Drug Name Tamoxifen      What  other symptoms do you have   Are you Having Heaviness or Tightness Yes    Are you having Pain Yes    Are you having pitting edema Yes      Right Upper Extremity Lymphedema   15 cm Proximal to Olecranon Process 26.5 cm    Olecranon Process 22.8 cm    15 cm Proximal to Ulnar Styloid Process 21.4 cm    Just Proximal to Ulnar Styloid Process 13.8 cm    At Base of 2nd Digit 5.8 cm      Left Upper Extremity Lymphedema   15 cm Proximal to Olecranon Process 25.8 cm    Olecranon Process 21.8 cm    15 cm Proximal to Ulnar Styloid Process 21 cm    Just Proximal to Ulnar Styloid Process 13.6 cm    At Base of 2nd Digit 5.5 cm                 Quick Dash - 08/19/20 0001    Open a tight or new jar No difficulty    Do heavy household chores (wash walls, wash floors) Mild difficulty    Carry a shopping bag or briefcase Mild difficulty    Wash your back Mild difficulty    Use a knife to cut food Mild difficulty    Recreational activities in which you take some force or impact through your arm, shoulder, or hand (golf, hammering, tennis) No difficulty    During the past week, to what extent has your arm, shoulder or hand problem interfered with your normal social activities with family, friends, neighbors, or groups? Slightly    During the past week, to what extent has your arm, shoulder or hand problem limited your work or other regular daily activities Slightly    Arm, shoulder, or hand pain. Mild    Tingling (pins and needles) in your arm, shoulder, or hand None    Difficulty Sleeping No difficulty    DASH Score 15.91 %            Objective measurements completed on examination: See above findings.               PT Education - 08/19/20 1226    Education Details Pt was educated in 4 post op exs to perform for ROM, Scar massage, and  was set up for ABC class on Nov. 15, 2021    Person(s) Educated Patient    Methods Explanation;Demonstration;Handout    Comprehension  Verbalized understanding;Returned demonstration            PT Short Term Goals - 08/19/20 1240      PT SHORT TERM GOAL #1   Title Pt will be independent in HEP for ROM and strength of right shoulder    Time 3    Period Weeks    Status New    Target Date 09/09/20             PT [place] Term Goals - 08/19/20 1241      PT [place] TERM GOAL #1   Title Pt will have AROM right shoulder flex and abd atleast  140 degrees for improved reaching ability    Baseline flex 125,  abd 110    Time 4    Period Weeks    Status New    Target Date 09/16/20      PT [place] TERM GOAL #2   Title Pt will have AROM right shoulder WNL to resume usual household and community activities    Time 6    Period Weeks    Status New      PT [place] TERM GOAL #3   Title Pt will be educated in precautions to decrease lymphedema    Time 2    Period Weeks    Status New    Target Date 09/02/20      PT [place] TERM GOAL #4   Title Pt will attend ABC class for further education on precautions/ROM and lympedema    Time 3    Period Weeks    Status New      PT [place] TERM GOAL #5   Title Pt will dress and bathe without limitation    Time 6    Period --    Status --    Target Date 09/16/20      Additional [place] Term Goals   Additional [place] Term Goals Yes      PT [place] TERM GOAL #6   Title Pts quick dash score will be 0 %                  Plan - 08/19/20 1231    Clinical Impression Statement Pt is s/p right breast mastectomy with SLNB on 02/19/20 followed by a month of radiation but no chemotherapy.  She is currently on tamoxifen.  Sheis complaining of right shoulder tightness and limitations of ROM, and with mild restriction in scar mobility.  there is no evidence of lymphedema.  She is limited with shoulder AROM in all directions.    Personal Factors and Comorbidities Comorbidity 2    Comorbidities lumpectomy with SLNB, radiation    Examination-Activity Limitations Bathing;Dressing;Reach Overhead;Lift     Examination-Participation Restrictions Other   home activities requiring reaching   Stability/Clinical Decision Making Stable/Uncomplicated    Clinical Decision Making Low    Rehab Potential Excellent    PT Frequency 2x / week    PT Duration 6 weeks    PT Treatment/Interventions ADLs/Self Care Home Management;Neuromuscular re-education;Therapeutic exercise;Therapeutic activities;Patient/family education;Manual techniques;Passive range of motion;Scar mobilization;Manual lymph drainage    PT Next Visit Plan MFR, PROM right shoulder, review and update HEP;may add wand, scar mobs    PT Home Exercise Plan 4 post op exs    Consulted and Agree with Plan of Care Patient  Patient will benefit from skilled therapeutic intervention in order to improve the following deficits and impairments:  Decreased range of motion, Increased fascial restricitons, Impaired UE functional use, Decreased knowledge of precautions, Decreased scar mobility, Decreased strength, Postural dysfunction  Visit Diagnosis: Aftercare following surgery for neoplasm  Stiffness of right shoulder, not elsewhere classified  Muscle weakness (generalized)     Problem List Patient Active Problem List   Diagnosis Date Noted  . Malignant neoplasm of upper-outer quadrant of right breast in female, estrogen receptor positive ([place]) 01/07/2020  . Mitral valve disorders(424.0) 07/29/2013    Class: History of  . Elevated C-reactive protein (CRP) 07/29/2013    Class: Chronic    Claris Pong, PT 08/19/2020, 1:00 PM  [place], [place], [address] Phone: [phone]   Fax:  [phone]  Name: Kara Kim MRN: [id number] Date of Birth: 04/21/1961

## 2020-08-19 NOTE — Telephone Encounter (Signed)
Hi Dr. Tarri Glenn,   Patient is requesting a transfer of care from [facility]. We received previous GI records from Dr. Erlinda Hong office for you to review has colonoscopy 9 years ago.   Please advise on scheduling.   Thank you

## 2020-08-20 NOTE — Telephone Encounter (Signed)
Okay to schedule outpatient consultation with me.   11 pages of records from Dr. Paulita Fujita are reviewed.  She was last seen in 2012 for change in bowel habits with alternating diarrhea and constipation, excessive gas, and bloating.  Symptoms were originally attributed to stress and travel given her high stress job.  However despite decreasing stress and travel her symptoms did not improve.  Dr. Paulita Fujita I recommended a colonoscopy as well as dietary modifications including a low lactose diet, low carbonation diet, and minimizing sugar substitutes.  Colonoscopy 5 years earlier in [place] that was normal per the patient report.  Prior anal fissure repair.  Biopsies from a colonoscopy at [facility] 12/19/2010 showed normal random colon biopsies.  Pathology showed melanosis coli.  Repeat colonoscopy recommended in 10 years.

## 2020-08-23 ENCOUNTER — Ambulatory Visit
Admission: RE | Admit: 2020-08-23 | Discharge: 2020-08-23 | Disposition: A | Discharge status: Home / Self Care | Source: Ambulatory Visit | Attending: General Surgery | Admitting: General Surgery

## 2020-08-23 DIAGNOSIS — N6310 Unspecified lump in the right breast, unspecified quadrant: Secondary | ICD-10-CM

## 2020-08-23 MED ORDER — GADOBUTROL 1 MMOL/ML IV SOLN: 6.0000 mL | Freq: Once | INTRAVENOUS | Status: DC | PRN

## 2020-08-23 MED ORDER — GADOBUTROL 1 MMOL/ML IV SOLN
5.0000 mL | Freq: Once | INTRAVENOUS | Status: AC | PRN
  Administered 2020-08-23: 5 mL via INTRAVENOUS

## 2020-08-24 ENCOUNTER — Ambulatory Visit: Attending: General Surgery

## 2020-08-24 ENCOUNTER — Other Ambulatory Visit: Payer: Self-pay

## 2020-08-24 ENCOUNTER — Encounter: Payer: Self-pay | Admitting: Gastroenterology

## 2020-08-24 DIAGNOSIS — M6281 Muscle weakness (generalized): Secondary | ICD-10-CM | POA: Diagnosis present

## 2020-08-24 DIAGNOSIS — M25611 Stiffness of right shoulder, not elsewhere classified: Secondary | ICD-10-CM | POA: Diagnosis present

## 2020-08-24 DIAGNOSIS — Z483 Aftercare following surgery for neoplasm: Secondary | ICD-10-CM

## 2020-08-24 NOTE — Telephone Encounter (Signed)
Left message to call back to sch consult.

## 2020-08-24 NOTE — Therapy (Signed)
[place], [place], [address] Phone: [phone]   Fax:  [phone]  Physical Therapy Treatment  Patient Details  Name: Tahjanae Kim MRN: [id number] Date of Birth: May 13, 1961 Referring Provider (PT): Dr.Toth   Encounter Date: 08/24/2020   PT End of Session - 08/24/20 1546    Visit Number 2    Number of Visits 13    Date for PT Re-Evaluation 09/30/20    PT Start Time 1779    PT Stop Time 1552    PT Time Calculation (min) 41 min    Activity Tolerance Patient tolerated treatment well           Past Medical History:  Diagnosis Date  . Breast cancer ([place])   . Complication of anesthesia   . Heart murmur   . Mitral valve disorders(424.0) 07/29/2013  . PONV (postoperative nausea and vomiting)   . Seasonal allergies   . Thyroid disease     Past Surgical History:  Procedure Laterality Date  . ANAL FISSURE REPAIR    . BREAST LUMPECTOMY WITH RADIOACTIVE SEED AND SENTINEL LYMPH NODE BIOPSY Right 02/19/2020   Procedure: RIGHT BREAST LUMPECTOMY X 2  WITH RADIOACTIVE SEED AND SENTINEL LYMPH NODE BIOPSY;  Surgeon: [name], MD;  Location: [facility];  Service: General;  Laterality: Right;  . CESAREAN SECTION    . eye lid lift    . tummy tuck      There were no vitals filed for this visit.   Subjective Assessment - 08/24/20 1511    Subjective Did OK with exs 2x/day. Sometimes it feels like I am going further    Pertinent History Pt was diagnosed with malignanat neoplasm of upper outer quadrant of right breast and underwent Right breast Lumpectomy with radioactive seed and SLNB on 02/19/20.  She had 1 month of radiation and is presently on tamoxifen.  She did not require chemotherapy.  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    Patient Stated Goals Improve shoulder ROM/decrease tightness    Currently in Pain? No/denies                             [facility] Adult PT  Treatment/Exercise - 08/24/20 0001      Shoulder Exercises: Supine   Flexion, Bilateral ER AAROM;Both;5 reps      Shoulder Exercises: Pulleys   Flexion 2 minutes    ABduction 2 minutes      Shoulder Exercises: Therapy Ball   Flexion Both;10 reps      Manual Therapy   Manual Therapy Myofascial release;Passive ROM    Myofascial Release medial right upper arm during stretching    Passive ROM PROM Right shoulder                    PT Short Term Goals - 08/19/20 1240      PT SHORT TERM GOAL #1   Title Pt will be independent in HEP for ROM and strength of right shoulder    Time 3    Period Weeks    Status New    Target Date 09/09/20             PT [place] Term Goals - 08/19/20 1241      PT Hearst TERM GOAL #1   Title Pt will have AROM right shoulder flex and abd atleast  140 degrees for improved reaching ability  Baseline flex 125,  abd 110    Time 4    Period Weeks    Status New    Target Date 09/16/20      PT B[place] TERM GOAL #2   Title Pt will have AROM right shoulder WNL to resume usual household and community activities    Time 6    Period Weeks    Status New      PT Beadle TERM GOAL #3   Title Pt will be educated in precautions to decrease lymphedema    Time 2    Period Weeks    Status New    Target Date 09/02/20      PT Winberg TERM GOAL #4   Title Pt will attend ABC class for further education on precautions/ROM and lympedema    Time 3    Period Weeks    Status New      PT Beck[place] TERM GOAL #5   Title Pt will dress and bathe without limitation    Time 6    Period --    Status --    Target Date 09/16/20      Additional [place] Term Goals   Additional [place] Term Goals Yes      PT Snyders TERM GOAL #6   Title Pts quick dash score will be 0 %                 Plan - 08/24/20 1554    Clinical Impression Statement therapy consisted of MFR to medial upper arm, and PROM to Right shoulder in all planes.  Also performed pulleys, ball rolls on wall and  standing lat stretch.  ROM visibly improved    Personal Factors and Comorbidities Comorbidity 2    Comorbidities lumpectomy with SLNB, radiation    Examination-Activity Limitations Bathing;Dressing;Reach Overhead;Lift    Examination-Participation Restrictions Other    Stability/Clinical Decision Making Stable/Uncomplicated    Rehab Potential Excellent    PT Frequency 2x / week    PT Duration 6 weeks    PT Treatment/Interventions ADLs/Self Care Home Management;Neuromuscular re-education;Therapeutic exercise;Therapeutic activities;Patient/family education;Manual techniques;Passive range of motion;Scar mobilization;Manual lymph drainage    PT Next Visit Plan MFR, PROM right shoulder, review and update HEP;may add wand, scar mobs    PT Home Exercise Plan 4 post op exs supine AA flex and ER, standing lat stretch    Consulted and Agree with Plan of Care Patient           Patient will benefit from skilled therapeutic intervention in order to improve the following deficits and impairments:  Decreased range of motion, Increased fascial restricitons, Impaired UE functional use, Decreased knowledge of precautions, Decreased scar mobility, Decreased strength, Postural dysfunction  Visit Diagnosis: Stiffness of right shoulder, not elsewhere classified  Muscle weakness (generalized)  Aftercare following surgery for neoplasm     Problem List Patient Active Problem List   Diagnosis Date Noted  . Malignant neoplasm of upper-outer quadrant of right breast in female, estrogen receptor positive ([place]) 01/07/2020  . Mitral valve disorders(424.0) 07/29/2013    Class: History of  . Elevated C-reactive protein (CRP) 07/29/2013    Class: Chronic    Kara Kim, PT 08/24/2020, 3:58 PM  [place], [place], [address] Phone: [phone]   Fax:  [phone]  Name: Kara Kim MRN: [id number] Date of Birth:  February 17, 1961

## 2020-08-26 ENCOUNTER — Other Ambulatory Visit: Payer: Self-pay

## 2020-08-26 ENCOUNTER — Ambulatory Visit

## 2020-08-26 DIAGNOSIS — M25611 Stiffness of right shoulder, not elsewhere classified: Secondary | ICD-10-CM | POA: Diagnosis not present

## 2020-08-26 DIAGNOSIS — Z483 Aftercare following surgery for neoplasm: Secondary | ICD-10-CM

## 2020-08-26 DIAGNOSIS — M6281 Muscle weakness (generalized): Secondary | ICD-10-CM

## 2020-08-26 NOTE — Telephone Encounter (Signed)
Okay to schedule outpatient consultation with me given her symptoms.

## 2020-08-26 NOTE — Therapy (Signed)
[place], [place], [address] Phone: [phone]   Fax:  [phone]  Physical Therapy Treatment  Patient Details  Name: Kara Kim MRN: [id number] Date of Birth: [birth year] Referring Provider (PT): Dr.Toth   Encounter Date: 08/26/2020   PT End of Session - 08/26/20 1557    PT Start Time 1507    PT Stop Time 1554    PT Time Calculation (min) 47 min    Activity Tolerance Patient tolerated treatment well           Past Medical History:  Diagnosis Date   Breast cancer ([place])    Complication of anesthesia    Heart murmur    Mitral valve disorders(424.0) 07/29/2013   PONV (postoperative nausea and vomiting)    Seasonal allergies    Thyroid disease     Past Surgical History:  Procedure Laterality Date   ANAL FISSURE REPAIR     BREAST LUMPECTOMY WITH RADIOACTIVE SEED AND SENTINEL LYMPH NODE BIOPSY Right 02/19/2020   Procedure: RIGHT BREAST LUMPECTOMY X 2  WITH RADIOACTIVE SEED AND SENTINEL LYMPH NODE BIOPSY;  Surgeon: [name], MD;  Location: [facility];  Service: General;  Laterality: Right;   CESAREAN SECTION     eye lid lift     tummy tuck      There were no vitals filed for this visit.   Subjective Assessment - 08/26/20 1507    Subjective I did fine after last visit, but felt tight when I did it this am. I ordered the pulleys    Pertinent History Pt was diagnosed with malignanat neoplasm of upper outer quadrant of right breast and underwent Right breast Lumpectomy with radioactive seed and SLNB on 02/19/20.  She had 1 month of radiation and is presently on tamoxifen.  She did not require chemotherapy.  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    Currently in Pain? No/denies                             [facility] Adult PT Treatment/Exercise - 08/26/20 0001      Shoulder Exercises: Standing   Other Standing Exercises shoulder flexion and scaption x 10       Shoulder Exercises: Pulleys   Flexion 2 minutes    ABduction 2 minutes      Shoulder Exercises: Therapy Ball   Flexion Both;10 reps      Shoulder Exercises: Stretch   Internal Rotation Stretch 5 reps    Internal Rotation Stretch Limitations --   10 sec with strap     Manual Therapy   Manual Therapy Myofascial release;Passive ROM    Myofascial Release medial right upper arm during stretching    Passive ROM PROM Right shoulder                  PT Education - 08/26/20 1555    Education Details Educated in IR with strap and standing flexion and abd and gave illustrated and written instructions    Person(s) Educated Patient    Methods Explanation;Demonstration;Handout    Comprehension Verbalized understanding;Returned demonstration            PT Short Term Goals - 08/19/20 1240      PT SHORT TERM GOAL #1   Title Pt will be independent in HEP for ROM and strength of right shoulder    Time 3    Period Weeks  Status New    Target Date 09/09/20             PT [place] Term Goals - 08/19/20 1241      PT Agena TERM GOAL #1   Title Pt will have AROM right shoulder flex and abd atleast  140 degrees for improved reaching ability    Baseline flex 125,  abd 110    Time 4    Period Weeks    Status New    Target Date 09/16/20      PT Negron TERM GOAL #2   Title Pt will have AROM right shoulder WNL to resume usual household and community activities    Time 6    Period Weeks    Status New      PT Roussell TERM GOAL #3   Title Pt will be educated in precautions to decrease lymphedema    Time 2    Period Weeks    Status New    Target Date 09/02/20      PT Mat[place] TERM GOAL #4   Title Pt will attend ABC class for further education on precautions/ROM and lympedema    Time 3    Period Weeks    Status New      PT Neubecker TERM GOAL #5   Title Pt will dress and bathe without limitation    Time 6    Period --    Status --    Target Date 09/16/20      Additional Sanna Term  Goals   Additional [place] Term Goals Yes      PT [place] TERM GOAL #6   Title Pts quick dash score will be 0 %                 Plan - 08/26/20 1559    Clinical Impression Statement Therapy consisted of PROM right shoulder in all directions with MFR to upper arm and lats, also performed therex including pulleys and balls, and standing jobes, IR with strap.  Able to raise arm higher at completion of rx    Personal Factors and Comorbidities Comorbidity 2    Comorbidities lumpectomy with SLNB, radiation    Examination-Activity Limitations Bathing;Dressing;Reach Overhead;Lift    Stability/Clinical Decision Making Stable/Uncomplicated    Rehab Potential Excellent    PT Frequency 2x / week    PT Duration 6 weeks    PT Treatment/Interventions ADLs/Self Care Home Management;Neuromuscular re-education;Therapeutic exercise;Therapeutic activities;Patient/family education;Manual techniques;Passive range of motion;Scar mobilization;Manual lymph drainage    PT Next Visit Plan MFR, PROM, update HEP, scar mobs    PT Home Exercise Plan 4 post op exs supine AA flex and ER, standing lat stretch,  IR with strap, jobes flex and scaption    Consulted and Agree with Plan of Care Patient           Patient will benefit from skilled therapeutic intervention in order to improve the following deficits and impairments:  Decreased range of motion, Increased fascial restricitons, Impaired UE functional use, Decreased knowledge of precautions, Decreased scar mobility, Decreased strength, Postural dysfunction  Visit Diagnosis: Stiffness of right shoulder, not elsewhere classified  Muscle weakness (generalized)  Aftercare following surgery for neoplasm     Problem List Patient Active Problem List   Diagnosis Date Noted   Malignant neoplasm of upper-outer quadrant of right breast in female, estrogen receptor positive ([place]) 01/07/2020   Mitral valve disorders(424.0) 07/29/2013    Class: History of    Elevated C-reactive protein (CRP) 07/29/2013  Class: Chronic    Claris Pong, PT 08/26/2020, 4:03 PM  [place], [place], [address] Phone: [phone]   Fax:  [phone]  Name: Sade Hollon MRN: [id number] Date of Birth: Sep 12, 1961

## 2020-08-26 NOTE — Telephone Encounter (Signed)
Oh ok because she currently has no symptoms and she was under the impression it was every 5 years not 10 years and she was scheduled at [facility] in December so I will call her back to cancel and place recall

## 2020-08-26 NOTE — Telephone Encounter (Signed)
The records available to me show that she needs a colonoscopy in 2012. December 2021 is pretty close if she would like to keep that appointment.

## 2020-08-26 NOTE — Patient Instructions (Signed)
Access Code: [email or web address] URL: [email or web address] Date: 08/26/2020 Prepared by: Cheral Almas  Exercises Standing Shoulder Internal Rotation Stretch with Towel - 2 x daily - 7 x weekly - 1 sets - 5 reps Standing Shoulder Flexion to 90 Degrees - 1 x daily - 7 x weekly - 1 sets - 10 reps Standing Shoulder Scaption - 1 x daily - 7 x weekly - 1 sets - 10 reps

## 2020-08-26 NOTE — Telephone Encounter (Signed)
Sounds good. Thank you

## 2020-08-26 NOTE — Telephone Encounter (Signed)
Hi Dr. Tarri Glenn,  Just to confirm patient is to have recall in 10 years and you also want her to have an OV?

## 2020-08-30 ENCOUNTER — Telehealth: Payer: Self-pay

## 2020-08-30 NOTE — Telephone Encounter (Signed)
Dr. Tarri Glenn.  I am aware that you reviewed her chart, but wanted to let you know-just to be safe that she was dx with breast 01/07/20  Completed radiation on 04/19/20 and is currently on Tamoxifen.  Ok to proceed as scheduled?  Thank you.

## 2020-08-30 NOTE — Telephone Encounter (Signed)
If she wishes to proceed with surveillance colonoscopy, it is okay to schedule at this time. Thank you.

## 2020-08-31 ENCOUNTER — Ambulatory Visit

## 2020-08-31 ENCOUNTER — Other Ambulatory Visit: Payer: Self-pay

## 2020-08-31 DIAGNOSIS — Z483 Aftercare following surgery for neoplasm: Secondary | ICD-10-CM

## 2020-08-31 DIAGNOSIS — M25611 Stiffness of right shoulder, not elsewhere classified: Secondary | ICD-10-CM

## 2020-08-31 DIAGNOSIS — M6281 Muscle weakness (generalized): Secondary | ICD-10-CM

## 2020-08-31 NOTE — Patient Instructions (Signed)
Access Code: [email or web address] URL: [email or web address] Date: 08/31/2020 Prepared by: Cheral Almas  Exercises Standing Shoulder Row with Anchored Resistance - 1 x daily - 7 x weekly - 1 sets - 10 reps Shoulder Extension with Resistance - 1 x daily - 7 x weekly - 1 sets - 10 reps Shoulder External Rotation and Scapular Retraction with Resistance - 1 x daily - 7 x weekly - 1 sets - 10 reps

## 2020-08-31 NOTE — Therapy (Signed)
[place], [place], [address] Phone: [phone]   Fax:  [phone]  Physical Therapy Treatment  Patient Details  Name: Kara Kim MRN: [id number] Date of Birth: 1960-10-26 Referring Provider (PT): Dr.Toth   Encounter Date: 08/31/2020   PT End of Session - 08/31/20 1553    Visit Number 4    Number of Visits 13    Date for PT Re-Evaluation 09/30/20    PT Start Time 3570    PT Stop Time 1351    PT Time Calculation (min) 45 min    Activity Tolerance Patient tolerated treatment well    Behavior During Therapy [facility] for tasks assessed/performed           Past Medical History:  Diagnosis Date  . Breast cancer ([place])   . Complication of anesthesia   . Heart murmur   . Mitral valve disorders(424.0) 07/29/2013  . PONV (postoperative nausea and vomiting)   . Seasonal allergies   . Thyroid disease     Past Surgical History:  Procedure Laterality Date  . ANAL FISSURE REPAIR    . BREAST LUMPECTOMY WITH RADIOACTIVE SEED AND SENTINEL LYMPH NODE BIOPSY Right 02/19/2020   Procedure: RIGHT BREAST LUMPECTOMY X 2  WITH RADIOACTIVE SEED AND SENTINEL LYMPH NODE BIOPSY;  Surgeon: [name], MD;  Location: [facility];  Service: General;  Laterality: Right;  . CESAREAN SECTION    . eye lid lift    . tummy tuck      There were no vitals filed for this visit.   Subjective Assessment - 08/31/20 1506    Subjective My right arm has been tight the last couple of days.  I don't know why.  Felt tighter this am. got pulleys and have used 1 time.    Pertinent History Pt was diagnosed with malignanat neoplasm of upper outer quadrant of right breast and underwent Right breast Lumpectomy with radioactive seed and SLNB on 02/19/20.  She had 1 month of radiation and is presently on tamoxifen.  She did not require chemotherapy.  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    Patient Stated Goals Improve shoulder  ROM/decrease tightness                             [facility] Adult PT Treatment/Exercise - 08/31/20 0001      Shoulder Exercises: Standing   External Rotation Strengthening;Both;10 reps    Theraband Level (Shoulder External Rotation) Level 1 (Yellow)    Extension AROM;Strengthening;Both;10 reps    Theraband Level (Shoulder Extension) Level 1 (Yellow)    Retraction Strengthening;Both;10 reps    Theraband Level (Shoulder Retraction) Level 1 (Yellow)    Other Standing Exercises --      Shoulder Exercises: Therapy Ball   Flexion Both;10 reps      Shoulder Exercises: Stretch   Corner Stretch 20 seconds;3 reps      Manual Therapy   Manual Therapy Myofascial release;Passive ROM    Joint Mobilization Gr3/4 post and inferior mobs    Myofascial Release medial right upper arm during stretching    Passive ROM PROM Right shoulder                  PT Education - 08/31/20 1552    Education Details Pt educated in scapular retraction, shoulder ext and bilateral ER with yellow band x 10.  Gave illustrated and written instructions  Person(s) Educated Patient    Methods Explanation;Demonstration;Handout    Comprehension Verbalized understanding;Returned demonstration            PT Short Term Goals - 08/19/20 1240      PT SHORT TERM GOAL #1   Title Pt will be independent in HEP for ROM and strength of right shoulder    Time 3    Period Weeks    Status New    Target Date 09/09/20             PT [place] Term Goals - 08/19/20 1241      PT Bollman TERM GOAL #1   Title Pt will have AROM right shoulder flex and abd atleast  140 degrees for improved reaching ability    Baseline flex 125,  abd 110    Time 4    Period Weeks    Status New    Target Date 09/16/20      PT Anes TERM GOAL #2   Title Pt will have AROM right shoulder WNL to resume usual household and community activities    Time 6    Period Weeks    Status New      PT [place] TERM GOAL #3   Title Pt  will be educated in precautions to decrease lymphedema    Time 2    Period Weeks    Status New    Target Date 09/02/20      PT Patricelli TERM GOAL #4   Title Pt will attend ABC class for further education on precautions/ROM and lympedema    Time 3    Period Weeks    Status New      PT Turney TERM GOAL #5   Title Pt will dress and bathe without limitation    Time 6    Period --    Status --    Target Date 09/16/20      Additional [place] Term Goals   Additional Cl[place] Term Goals Yes      PT Schueler TERM GOAL #6   Title Pts quick dash score will be 0 %                 Plan - 08/31/20 1554    Clinical Impression Statement Pts therapy consisteed of joint mobs grade 3/4 post and inferior, PROM to right shoulder all planes and therapeutic exercises for Theraband strengthening.  Pt. stretched out very nicely today.    Personal Factors and Comorbidities Comorbidity 2    Comorbidities lumpectomy with SLNB, radiation    Examination-Activity Limitations Bathing;Dressing;Reach Overhead;Lift    Examination-Participation Restrictions Other    Stability/Clinical Decision Making Stable/Uncomplicated    Clinical Decision Making Low    Rehab Potential Excellent    PT Frequency 2x / week    PT Duration 6 weeks    PT Treatment/Interventions ADLs/Self Care Home Management;Neuromuscular re-education;Therapeutic exercise;Therapeutic activities;Patient/family education;Manual techniques;Passive range of motion;Scar mobilization;Manual lymph drainage    PT Next Visit Plan MFR, joint mobs,PROM, update HEP, scar mobs, check insurance for compression sleeve.   PT Home Exercise Plan 4 post op exs supine AA flex and ER, standing lat stretch,  IR with strap, jobes flex and scaption, theraband SR, ext and bilateral ER    Consulted and Agree with Plan of Care Patient           Patient will benefit from skilled therapeutic intervention in order to improve the following deficits and impairments:  Decreased range  of motion, Increased fascial  restricitons, Impaired UE functional use, Decreased knowledge of precautions, Decreased scar mobility, Decreased strength, Postural dysfunction  Visit Diagnosis: Stiffness of right shoulder, not elsewhere classified  Muscle weakness (generalized)  Aftercare following surgery for neoplasm     Problem List Patient Active Problem List   Diagnosis Date Noted  . Malignant neoplasm of upper-outer quadrant of right breast in female, estrogen receptor positive ([place]) 01/07/2020  . Mitral valve disorders(424.0) 07/29/2013    Class: History of  . Elevated C-reactive protein (CRP) 07/29/2013    Class: Chronic    Kara Kim, PT 08/31/2020, 3:58 PM  [place], [place], [address] Phone: [phone]   Fax:  [phone]  Name: Kara Kim MRN: [id number] Date of Birth: 11/22/1960

## 2020-09-06 ENCOUNTER — Encounter

## 2020-09-07 ENCOUNTER — Ambulatory Visit

## 2020-09-07 ENCOUNTER — Other Ambulatory Visit: Payer: Self-pay

## 2020-09-07 ENCOUNTER — Ambulatory Visit (AMBULATORY_SURGERY_CENTER): Payer: Self-pay | Admitting: *Deleted

## 2020-09-07 VITALS — Ht 63.0 in | Wt 126.0 lb

## 2020-09-07 DIAGNOSIS — Z1211 Encounter for screening for malignant neoplasm of colon: Secondary | ICD-10-CM

## 2020-09-07 DIAGNOSIS — M6281 Muscle weakness (generalized): Secondary | ICD-10-CM

## 2020-09-07 DIAGNOSIS — M25611 Stiffness of right shoulder, not elsewhere classified: Secondary | ICD-10-CM | POA: Diagnosis not present

## 2020-09-07 DIAGNOSIS — Z483 Aftercare following surgery for neoplasm: Secondary | ICD-10-CM

## 2020-09-07 MED ORDER — SUPREP BOWEL PREP KIT 17.5-3.13-1.6 GM/177ML PO SOLN
1.0000 | Freq: Once | ORAL | 0 refills | Status: AC
Start: 2020-09-07 — End: 2020-09-07

## 2020-09-07 NOTE — Therapy (Signed)
[place], [place], [address] Phone: [phone]   Fax:  [phone]  Physical Therapy Treatment  Patient Details  Name: Kara Kim MRN: [id number] Date of Birth: January 29, 1961 Referring Provider (PT): Dr.Toth   Encounter Date: 09/07/2020   PT End of Session - 09/07/20 1545    Visit Number 5    Date for PT Re-Evaluation 09/30/20    PT Start Time 1504    PT Stop Time 1547    PT Time Calculation (min) 43 min    Activity Tolerance Patient tolerated treatment well    Behavior During Therapy [facility] for tasks assessed/performed           Past Medical History:  Diagnosis Date  . Anemia   . Breast cancer ([place])    right  . Complication of anesthesia   . Heart murmur    then another MD said no murmur  . Mitral valve disorders(424.0) 07/29/2013  . Osteopenia   . PONV (postoperative nausea and vomiting)   . Seasonal allergies   . Thyroid disease     Past Surgical History:  Procedure Laterality Date  . ANAL FISSURE REPAIR    . BREAST LUMPECTOMY WITH RADIOACTIVE SEED AND SENTINEL LYMPH NODE BIOPSY Right 02/19/2020   Procedure: RIGHT BREAST LUMPECTOMY X 2  WITH RADIOACTIVE SEED AND SENTINEL LYMPH NODE BIOPSY;  Surgeon: [name], MD;  Location: [facility];  Service: General;  Laterality: Right;  . CESAREAN SECTION    . COLONOSCOPY    . eye lid lift    . tummy tuck      There were no vitals filed for this visit.   Subjective Assessment - 09/07/20 1505    Subjective Feeling better this week, but I am sore under my arm pit.   Have been doing exercises but I forgot to use the pulleys    Pertinent History Pt was diagnosed with malignanat neoplasm of upper outer quadrant of right breast and underwent Right breast Lumpectomy with radioactive seed and SLNB on 02/19/20.  She had 1 month of radiation and is presently on tamoxifen.  She did not require chemotherapy.  She has had left frozen shoulder in the  past.    Patient Stated Goals Improve shoulder ROM/decrease tightness    Currently in Pain? No/denies                             [facility] Adult PT Treatment/Exercise - 09/07/20 0001      Shoulder Exercises: Standing   External Rotation Strengthening;Both;10 reps    Theraband Level (Shoulder External Rotation) Level 1 (Yellow)    Extension Strengthening;12 reps    Theraband Level (Shoulder Extension) Level 1 (Yellow)    Retraction Strengthening;Both;12 reps    Theraband Level (Shoulder Retraction) Level 1 (Yellow)    Other Standing Exercises shoulder flex and scaption      Shoulder Exercises: Pulleys   Flexion 2 minutes    ABduction 2 minutes      Shoulder Exercises: Therapy Ball   Flexion Both;10 reps    Other Therapy Ball Exercises 2 D wall stabs x 10      Shoulder Exercises: Stretch   Other Shoulder Stretches standing lat stretch x 3      Manual Therapy   Manual Therapy Myofascial release;Passive ROM    Joint Mobilization Gr3/4 post and inferior mobs    Myofascial Release medial right upper arm during stretching  Passive ROM PROM Right shoulder                    PT Short Term Goals - 08/19/20 1240      PT SHORT TERM GOAL #1   Title Pt will be independent in HEP for ROM and strength of right shoulder    Time 3    Period Weeks    Status New    Target Date 09/09/20             PT [place] Term Goals - 08/19/20 1241      PT E[place]on TERM GOAL #1   Title Pt will have AROM right shoulder flex and abd atleast  140 degrees for improved reaching ability    Baseline flex 125,  abd 110    Time 4    Period Weeks    Status New    Target Date 09/16/20      PT E[place]on TERM GOAL #2   Title Pt will have AROM right shoulder WNL to resume usual household and community activities    Time 6    Period Weeks    Status New      PT Barros TERM GOAL #3   Title Pt will be educated in precautions to decrease lymphedema    Time 2    Period Weeks    Status  New    Target Date 09/02/20      PT [place] TERM GOAL #4   Title Pt will attend ABC class for further education on precautions/ROM and lympedema    Time 3    Period Weeks    Status New      PT Radilla TERM GOAL #5   Title Pt will dress and bathe without limitation    Time 6    Period --    Status --    Target Date 09/16/20      Additional [place] Term Goals   Additional [place] Term Goals Yes      PT Luo TERM GOAL #6   Title Pts quick dash score will be 0 %                 Plan - 09/07/20 1549    Clinical Impression Statement Pt to be measured for sleeve and gauntlet on Monday with Melissa.  Pt with tightness in axilla and lats today, but loosened up after exercises    Personal Factors and Comorbidities Comorbidity 2    Comorbidities lumpectomy with SLNB, radiation    Examination-Activity Limitations Bathing;Dressing;Reach Overhead;Lift    Stability/Clinical Decision Making Stable/Uncomplicated    Clinical Decision Making Low    Rehab Potential Excellent    PT Duration 6 weeks    PT Treatment/Interventions ADLs/Self Care Home Management;Neuromuscular re-education;Therapeutic exercise;Therapeutic activities;Patient/family education;Manual techniques;Passive range of motion;Scar mobilization;Manual lymph drainage    PT Next Visit Plan MFR, joint mobs,PROM, update HEP, scar mobs, review precautions for Lymphedema, wearing of sleeve    PT Home Exercise Plan 4 post op exs supine AA flex and ER, standing lat stretch,  IR with strap, jobes flex and scaption, theraband SR, ext and bilateral ER    Recommended Other Services measure for sleeve/gauntlet class 1 on Monday    Consulted and Agree with Plan of Care Patient           Patient will benefit from skilled therapeutic intervention in order to improve the following deficits and impairments:  Decreased range of motion, Increased fascial restricitons, Impaired UE functional use,  Decreased knowledge of precautions, Decreased scar  mobility, Decreased strength, Postural dysfunction  Visit Diagnosis: Stiffness of right shoulder, not elsewhere classified  Muscle weakness (generalized)  Aftercare following surgery for neoplasm     Problem List Patient Active Problem List   Diagnosis Date Noted  . Malignant neoplasm of upper-outer quadrant of right breast in female, estrogen receptor positive ([place]) 01/07/2020  . Mitral valve disorders(424.0) 07/29/2013    Class: History of  . Elevated C-reactive protein (CRP) 07/29/2013    Class: Chronic    Sherrin Daisy 09/07/2020, 3:53 PM  [place], [place], [address] Phone: [phone]   Fax:  [phone]  Name: Ginger Leeth MRN: [id number] Date of Birth: [birth year]

## 2020-09-07 NOTE — Progress Notes (Signed)
Pt states she has chronic inflammation from eggs- increases CRP.  No SOB, dyspnea.  Eats eggs occasionally and cakes  No soy allergy  [employer] 02-01-20, Pfizer booster 08-17-20  Pt is aware that care partner will wait in the car during procedure; if they feel like they will be too hot or cold to wait in the car; they may wait in the 4 th floor lobby. Patient is aware to bring only one care partner. We want them to wear a mask (we do not have any that we can provide them), practice social distancing, and we will check their temperatures when they get here.  I did remind the patient that their care partner needs to stay in the parking lot the entire time and have a cell phone available, we will call them when the pt is ready for discharge. Patient will wear mask into building.  Pt has hx of PONV, denies difficulty with intubation, or fam hx/hx of malignant hyperthermia   No egg or soy allergy  No home oxygen use   No medications for weight loss taken  Pt denies issues with constipation- takes laxatives only PRN

## 2020-09-09 ENCOUNTER — Encounter: Payer: Self-pay | Admitting: Physical Therapy

## 2020-09-09 ENCOUNTER — Ambulatory Visit: Admitting: Physical Therapy

## 2020-09-09 ENCOUNTER — Other Ambulatory Visit: Payer: Self-pay

## 2020-09-09 DIAGNOSIS — M25611 Stiffness of right shoulder, not elsewhere classified: Secondary | ICD-10-CM

## 2020-09-09 DIAGNOSIS — Z483 Aftercare following surgery for neoplasm: Secondary | ICD-10-CM

## 2020-09-09 DIAGNOSIS — M6281 Muscle weakness (generalized): Secondary | ICD-10-CM

## 2020-09-09 NOTE — Therapy (Signed)
[place], [place], [address] Phone: [phone]   Fax:  [phone]  Physical Therapy Treatment  Patient Details  Name: Kara Kim MRN: [id number] Date of Birth: Dec 23, 1960 Referring Provider (PT): Dr.Toth   Encounter Date: 09/09/2020   PT End of Session - 09/09/20 1353    Visit Number 6    Number of Visits 13    Date for PT Re-Evaluation 09/30/20    PT Start Time 4970    PT Stop Time 1345    PT Time Calculation (min) 40 min    Activity Tolerance Patient tolerated treatment well    Behavior During Therapy [facility] for tasks assessed/performed           Past Medical History:  Diagnosis Date  . Anemia   . Breast cancer ([place])    right  . Complication of anesthesia   . Heart murmur    then another MD said no murmur  . Mitral valve disorders(424.0) 07/29/2013  . Osteopenia   . PONV (postoperative nausea and vomiting)   . Seasonal allergies   . Thyroid disease     Past Surgical History:  Procedure Laterality Date  . ANAL FISSURE REPAIR    . BREAST LUMPECTOMY WITH RADIOACTIVE SEED AND SENTINEL LYMPH NODE BIOPSY Right 02/19/2020   Procedure: RIGHT BREAST LUMPECTOMY X 2  WITH RADIOACTIVE SEED AND SENTINEL LYMPH NODE BIOPSY;  Surgeon: [name], MD;  Location: [facility];  Service: General;  Laterality: Right;  . CESAREAN SECTION    . COLONOSCOPY    . eye lid lift    . tummy tuck      There were no vitals filed for this visit.   Subjective Assessment - 09/09/20 1309    Subjective I just did the after breast cancer class. .    Pertinent History Pt was diagnosed with malignanat neoplasm of upper outer quadrant of right breast and underwent Right breast Lumpectomy with radioactive seed and SLNB on 02/19/20.  She had 1 month of radiation and is presently on tamoxifen.  She did not require chemotherapy.  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    Patient Stated Goals Improve shoulder  ROM/decrease tightness    Currently in Pain? No/denies                             [facility] Adult PT Treatment/Exercise - 09/09/20 0001      Exercises   Exercises Shoulder      Shoulder Exercises: Supine   Protraction AROM;Right;10 reps      Shoulder Exercises: Sidelying   ABduction AROM;Right;5 reps    Other Sidelying Exercises hand pointed to ceiling for 10 reps of circles and figure 8       Shoulder Exercises: Standing   Row Strengthening;Right;Left;20 reps;Theraband    Theraband Level (Shoulder Row) Level 2 (Red)    Row Limitations cues to keep elbows straight to hips with effort to pinch shoulder blades and keep core tights.  10 reps bilaterally and 10 reps alternating       Manual Therapy   Manual Therapy Joint mobilization;Soft tissue mobilization;Myofascial release    Joint Mobilization gentle posterior glides     Soft tissue mobilization with coconut oil to pec major/ minor and deep work to along lateral scapular border in supine an sidelying     Myofascial Release medial right upper arm during stretching    Passive  ROM PROM Right shoulder wiht stretching into end range                     PT Short Term Goals - 08/19/20 1240      PT SHORT TERM GOAL #1   Title Pt will be independent in HEP for ROM and strength of right shoulder    Time 3    Period Weeks    Status New    Target Date 09/09/20             PT [place] Term Goals - 08/19/20 1241      PT [place] TERM GOAL #1   Title Pt will have AROM right shoulder flex and abd atleast  140 degrees for improved reaching ability    Baseline flex 125,  abd 110    Time 4    Period Weeks    Status New    Target Date 09/16/20      PT [place] TERM GOAL #2   Title Pt will have AROM right shoulder WNL to resume usual household and community activities    Time 6    Period Weeks    Status New      PT [place] TERM GOAL #3   Title Pt will be educated in precautions to decrease lymphedema    Time 2     Period Weeks    Status New    Target Date 09/02/20      PT Fl[place] TERM GOAL #4   Title Pt will attend ABC class for further education on precautions/ROM and lympedema    Time 3    Period Weeks    Status New      PT Arai TERM GOAL #5   Title Pt will dress and bathe without limitation    Time 6    Period --    Status --    Target Date 09/16/20      Additional [place] Term Goals   Additional [place] Term Goals Yes      PT [place]ia TERM GOAL #6   Title Pts quick dash score will be 0 %                 Plan - 09/09/20 1354    Clinical Impression Statement Pt reports she is getting better with PT but still has some tightness in her shoulder.  Added rows for scapular strengthening and endurance today and focused on deep work to [employer] and lateral scapular muscles    Comorbidities lumpectomy with SLNB, radiation    Examination-Activity Limitations Bathing;Dressing;Reach Overhead;Lift    Stability/Clinical Decision Making Stable/Uncomplicated    Rehab Potential Excellent    PT Frequency 2x / week    PT Next Visit Plan MFR, joint mobs,PROM, update HEP, scar mobs, review precautions for Lymphedema, wearing of sleeve    PT Home Exercise Plan 4 post op exs supine AA flex and ER, standing lat stretch,  IR with strap, jobes flex and scaption, theraband SR, ext and bilateral ER standing rows    Consulted and Agree with Plan of Care Patient           Patient will benefit from skilled therapeutic intervention in order to improve the following deficits and impairments:  Decreased range of motion, Increased fascial restricitons, Impaired UE functional use, Decreased knowledge of precautions, Decreased scar mobility, Decreased strength, Postural dysfunction  Visit Diagnosis: Stiffness of right shoulder, not elsewhere classified  Muscle weakness (generalized)  Aftercare following surgery for neoplasm  Problem List Patient Active Problem List   Diagnosis Date Noted  . Malignant neoplasm of  upper-outer quadrant of right breast in female, estrogen receptor positive ([place]) 01/07/2020  . Mitral valve disorders(424.0) 07/29/2013    Class: History of  . Elevated C-reactive protein (CRP) 07/29/2013    Class: Chronic   Donato Heinz. Owens Shark PT  Norwood Levo 09/09/2020, 1:56 PM  [place], [place], [address] Phone: [phone]   Fax:  [phone]  Name: Kara Kim MRN: [id number] Date of Birth: June 16, 1961

## 2020-09-20 ENCOUNTER — Telehealth: Payer: Self-pay

## 2020-09-20 ENCOUNTER — Encounter: Payer: Self-pay | Admitting: Oncology

## 2020-09-20 NOTE — Telephone Encounter (Signed)
This LPN called pt who states she is having BM's daily, but they are hard and not regular like she is used to. Pt states she normally has a BM every morning. Pt denies stomach pain. Black stools, nausea/vomiting, mucous in stool. Pt states stools are formed. This LPN recommended pt continue Senna 8.6 mg and incorportate Miralax in coffee every morning to see if this will help, then taper down. Pt verbalized thanks and understanding.

## 2020-09-21 ENCOUNTER — Ambulatory Visit (INDEPENDENT_AMBULATORY_CARE_PROVIDER_SITE_OTHER): Admitting: Plastic Surgery

## 2020-09-21 ENCOUNTER — Encounter: Payer: Self-pay | Admitting: Plastic Surgery

## 2020-09-21 ENCOUNTER — Ambulatory Visit

## 2020-09-21 ENCOUNTER — Other Ambulatory Visit: Payer: Self-pay

## 2020-09-21 VITALS — BP 125/84 | HR 65 | Temp 98.1°F | Ht 63.0 in | Wt 126.4 lb

## 2020-09-21 DIAGNOSIS — Z170 Estrogen receptor positive status [ER+]: Secondary | ICD-10-CM

## 2020-09-21 DIAGNOSIS — Z483 Aftercare following surgery for neoplasm: Secondary | ICD-10-CM

## 2020-09-21 DIAGNOSIS — M6281 Muscle weakness (generalized): Secondary | ICD-10-CM

## 2020-09-21 DIAGNOSIS — N6489 Other specified disorders of breast: Secondary | ICD-10-CM | POA: Diagnosis not present

## 2020-09-21 DIAGNOSIS — M25611 Stiffness of right shoulder, not elsewhere classified: Secondary | ICD-10-CM

## 2020-09-21 DIAGNOSIS — C50411 Malignant neoplasm of upper-outer quadrant of right female breast: Secondary | ICD-10-CM

## 2020-09-21 NOTE — Progress Notes (Addendum)
Patient ID: Kara Kim, female    DOB: 08-Dec-1960, 60 y.o.   MRN: [id number]   Chief Complaint  Patient presents with  . Breast Problem    The patient is a 59 year old female here for evaluation of her breasts.  She had right breast cancer and underwent partial mastectomies by Dr. Marlou Starks.  She then had radiation which ended in June 2021.  Her MRI was November 2021.  She is not a smoker and does not have diabetes.  It was in the upper outer quadrant. She is 5 feet 3 inches tall and weighs 126.  She has asymmetry with the right breast being smaller and perkier than the left.  She has grade 2-3 ptosis on the left.  He has had children and breast-fed.  She is about 1 size cup difference between the right and the left breast.  I would say the right is A cup and the left is a B cup.  She would like surgery for correction of the asymmetry.  No lumps or bumps are noted.  She does have a history of extreme density of her breasts.   Review of Systems  Constitutional: Negative.  Negative for activity change.  HENT: Negative.   Eyes: Negative.   Respiratory: Negative for chest tightness.   Cardiovascular: Negative.  Negative for leg swelling.  Gastrointestinal: Negative for abdominal distention and abdominal pain.  Endocrine: Negative.   Genitourinary: Negative.   Musculoskeletal: Negative.   Neurological: Negative.   Hematological: Negative.     Past Medical History:  Diagnosis Date  . Anemia   . Breast cancer ([place])    right  . Complication of anesthesia   . Heart murmur    then another MD said no murmur  . Mitral valve disorders(424.0) 07/29/2013  . Osteopenia   . PONV (postoperative nausea and vomiting)   . Seasonal allergies   . Thyroid disease     Past Surgical History:  Procedure Laterality Date  . ANAL FISSURE REPAIR    . BREAST LUMPECTOMY WITH RADIOACTIVE SEED AND SENTINEL LYMPH NODE BIOPSY Right 02/19/2020   Procedure: RIGHT BREAST LUMPECTOMY X 2  WITH RADIOACTIVE SEED  AND SENTINEL LYMPH NODE BIOPSY;  Surgeon: [name], MD;  Location: [facility];  Service: General;  Laterality: Right;  . CESAREAN SECTION    . COLONOSCOPY    . eye lid lift    . tummy tuck        Current Outpatient Medications:  .  ALPRAZolam (XANAX) 0.5 MG tablet, Take 1 tablet (0.5 mg total) by mouth at bedtime as needed for anxiety., Disp: 5 tablet, Rfl: 0 .  Cholecalciferol (VITAMIN D-3) 125 MCG (5000 UT) TABS, Take 5,000 Units by mouth daily. , Disp: , Rfl:  .  fexofenadine (ALLEGRA) 180 MG tablet, Take 180 mg by mouth daily., Disp: , Rfl:  .  fluticasone (FLONASE) 50 MCG/ACT nasal spray, Place 2 sprays into both nostrils daily., Disp: , Rfl:  .  Multiple Vitamin (MULTIVITAMIN PO), Take by mouth daily., Disp: , Rfl:  .  NON FORMULARY, Lipisomal Gluthalione  250 mg daily, Disp: , Rfl:  .  NON FORMULARY, Liver refresh  daily, Disp: , Rfl:  .  Omega-3 Fatty Acids (FISH OIL PO), Take 2,000 mg by mouth daily., Disp: , Rfl:  .  polyethylene glycol (MIRALAX / GLYCOLAX) 17 g packet, Take 17 g by mouth daily., Disp: , Rfl:  .  Probiotic Product (PROBIOTIC DAILY PO), Take 1 tablet by  mouth daily., Disp: , Rfl:  .  senna (SENOKOT) 8.6 MG tablet, Take 1 tablet by mouth as needed for constipation., Disp: , Rfl:  .  tamoxifen (NOLVADEX) 20 MG tablet, Take 20 mg by mouth daily., Disp: , Rfl:  .  thyroid (ARMOUR) 60 MG tablet, Take 60 mg by mouth daily before breakfast., Disp: , Rfl:  .  Turmeric (QC TUMERIC COMPLEX PO), Take 500 mg by mouth daily., Disp: , Rfl:    Objective:   Vitals:   09/21/20 1333  BP: 125/84  Pulse: 65  Temp: 98.1 F (36.7 C)  SpO2: 99%    Physical Exam Vitals and nursing note reviewed.  Constitutional:      Appearance: Normal appearance.  HENT:     Head: Normocephalic and atraumatic.  Cardiovascular:     Rate and Rhythm: Normal rate.     Pulses: Normal pulses.  Pulmonary:     Effort: Pulmonary effort is normal.  Abdominal:     General: Abdomen is flat.  There is no distension.     Tenderness: There is no abdominal tenderness.  Skin:    General: Skin is warm.     Capillary Refill: Capillary refill takes less than 2 seconds.  Neurological:     General: No focal deficit present.     Mental Status: She is alert and oriented to person, place, and time.  Psychiatric:        Mood and Affect: Mood normal.        Behavior: Behavior normal.     Assessment & Plan:  Malignant neoplasm of upper-outer quadrant of right breast in female, estrogen receptor positive ([facility])  Breast asymmetry  Due to the patient's history of extreme density she does not want to have fat grafting of the right breast.  I think that that is certainly the safest option.  She would like to avoid implants if she can as well.  This means that a left mastopexy will be her safest option for improved symmetry.  Plan for left breast mastopexy.  Pictures were obtained of the patient and placed in the chart with the patient's or guardian's permission.  [place], DO

## 2020-09-21 NOTE — Therapy (Signed)
[place], [place], [address] Phone: [phone]   Fax:  [phone]  Physical Therapy Treatment  Patient Details  Name: Kara Kim MRN: [id number] Date of Birth: 1961-06-18 Referring Provider (PT): Dr.Toth   Encounter Date: 09/21/2020   PT End of Session - 09/21/20 1733    Visit Number 7    Number of Visits 13    Date for PT Re-Evaluation 09/30/20    PT Start Time 7622    PT Stop Time 1600    PT Time Calculation (min) 53 min    Activity Tolerance Patient tolerated treatment well    Behavior During Therapy [facility] for tasks assessed/performed           Past Medical History:  Diagnosis Date  . Anemia   . Breast cancer ([place])    right  . Complication of anesthesia   . Heart murmur    then another MD said no murmur  . Mitral valve disorders(424.0) 07/29/2013  . Osteopenia   . PONV (postoperative nausea and vomiting)   . Seasonal allergies   . Thyroid disease     Past Surgical History:  Procedure Laterality Date  . ANAL FISSURE REPAIR    . BREAST LUMPECTOMY WITH RADIOACTIVE SEED AND SENTINEL LYMPH NODE BIOPSY Right 02/19/2020   Procedure: RIGHT BREAST LUMPECTOMY X 2  WITH RADIOACTIVE SEED AND SENTINEL LYMPH NODE BIOPSY;  Surgeon: [name], MD;  Location: [place];  Service: General;  Laterality: Right;  . CESAREAN SECTION    . COLONOSCOPY    . eye lid lift    . tummy tuck      There were no vitals filed for this visit.   Subjective Assessment - 09/21/20 1508    Subjective Right arm is doing OK.  Drove back from [facility] yesterday and my anterior shoulder/chest is really sore    Pertinent History Pt was diagnosed with malignanat neoplasm of upper outer quadrant of right breast and underwent Right breast Lumpectomy with radioactive seed and SLNB on 02/19/20.  She had 1 month of radiation and is presently on tamoxifen.  She did not require chemotherapy.  She has had left frozen shoulder  in the past.    Currently in Pain? No/denies    Pain Score 0-No pain    Pain Descriptors / Indicators Tender    Pain Type Acute pain                             [facility] Adult PT Treatment/Exercise - 09/21/20 0001      Shoulder Exercises: Standing   Extension Strengthening;Both;10 reps    Theraband Level (Shoulder Extension) Level 1 (Yellow)    Row Both;10 reps    Theraband Level (Shoulder Row) Level 2 (Red)    Other Standing Exercises standing jobes flex , scaption, abd x10      Shoulder Exercises: Therapy Ball   Flexion Both;10 reps    Other Therapy Ball Exercises Scap stabs (R) purple ball 2 D x 10      Manual Therapy   Soft tissue mobilization with coconut oil to pec major/ minor and deep work to along lateral scapular border in supine and sidelying  and to medial scapular border in SL    Myofascial Release medial right upper arm during stretching    Passive ROM PROM Right shoulder with stretching into end ranges  PT Short Term Goals - 08/19/20 1240      PT SHORT TERM GOAL #1   Title Pt will be independent in HEP for ROM and strength of right shoulder    Time 3    Period Weeks    Status New    Target Date 09/09/20             PT Murdock Term Goals - 08/19/20 1241      PT Soh TERM GOAL #1   Title Pt will have AROM right shoulder flex and abd atleast  140 degrees for improved reaching ability    Baseline flex 125,  abd 110    Time 4    Period Weeks    Status New    Target Date 09/16/20      PT Nuzum TERM GOAL #2   Title Pt will have AROM right shoulder WNL to resume usual household and community activities    Time 6    Period Weeks    Status New      PT Crampton TERM GOAL #3   Title Pt will be educated in precautions to decrease lymphedema    Time 2    Period Weeks    Status New    Target Date 09/02/20      PT Linson TERM GOAL #4   Title Pt will attend ABC class for further education on precautions/ROM and lympedema     Time 3    Period Weeks    Status New      PT Danser TERM GOAL #5   Title Pt will dress and bathe without limitation    Time 6    Period --    Status --    Target Date 09/16/20      Additional Sutch Term Goals   Additional Portner Term Goals Yes      PT Dutkiewicz TERM GOAL #6   Title Pts quick dash score will be 0 %                 Plan - 09/21/20 1734    Clinical Impression Statement Pt noted increased Pec soreness today after a [place] drive yesterday.  Therapy consisted of STW to Pecs,in supine, lateral scapula in supine and SL, and medial scapular border in SL.  PROM was performed to right shoulder which was slightly tighter than when last seen.  Her right arm felt much heavier when performing standing jobes exercises for flex, scaption and abd x 10 reps.   Pts garments were shipped.  Pt is going to call and be sure they are covered with her insurance since they ordered 3 and she only requires 1 as prophylactic.Marland Kitchen    Personal Factors and Comorbidities Comorbidity 2    Comorbidities lumpectomy with SLNB, radiation    Examination-Activity Limitations Bathing;Dressing;Reach Overhead;Lift    Examination-Participation Restrictions Other    Stability/Clinical Decision Making Stable/Uncomplicated    Rehab Potential Excellent    PT Frequency 2x / week    PT Duration 6 weeks    PT Treatment/Interventions ADLs/Self Care Home Management;Neuromuscular re-education;Therapeutic exercise;Therapeutic activities;Patient/family education;Manual techniques;Passive range of motion;Scar mobilization;Manual lymph drainage    PT Next Visit Plan MFR, joint mobs,PROM, update HEP, scar mobs, review precautions for Lymphedema, wearing of sleeve and donning/doffing when she gets sleeve, UE strength    PT Home Exercise Plan 4 post op exs supine AA flex and ER, standing lat stretch,  IR with strap, jobes flex and scaption, theraband SR, ext and bilateral  ER standing rows    Consulted and Agree with Plan of Care  Patient           Patient will benefit from skilled therapeutic intervention in order to improve the following deficits and impairments:  Decreased range of motion, Increased fascial restricitons, Impaired UE functional use, Decreased knowledge of precautions, Decreased scar mobility, Decreased strength, Postural dysfunction  Visit Diagnosis: Muscle weakness (generalized)  Stiffness of right shoulder, not elsewhere classified  Aftercare following surgery for neoplasm     Problem List Patient Active Problem List   Diagnosis Date Noted  . Breast asymmetry 09/21/2020  . Malignant neoplasm of upper-outer quadrant of right breast in female, estrogen receptor positive ([place]) 01/07/2020  . Mitral valve disorders(424.0) 07/29/2013    Class: History of  . Elevated C-reactive protein (CRP) 07/29/2013    Class: Chronic    Claris Pong 09/21/2020, 5:41 PM  [place], [place], [address] Phone: [phone]   Fax:  [phone]  Name: Kara Kim MRN: [id number] Date of Birth: [birth year] Cheral Almas, PT 09/21/20 5:42 PM

## 2020-09-23 ENCOUNTER — Ambulatory Visit: Attending: General Surgery | Admitting: Physical Therapy

## 2020-09-23 ENCOUNTER — Other Ambulatory Visit: Payer: Self-pay

## 2020-09-23 ENCOUNTER — Encounter: Payer: Self-pay | Admitting: Physical Therapy

## 2020-09-23 DIAGNOSIS — M6281 Muscle weakness (generalized): Secondary | ICD-10-CM | POA: Insufficient documentation

## 2020-09-23 DIAGNOSIS — M25611 Stiffness of right shoulder, not elsewhere classified: Secondary | ICD-10-CM | POA: Insufficient documentation

## 2020-09-23 DIAGNOSIS — Z483 Aftercare following surgery for neoplasm: Secondary | ICD-10-CM | POA: Diagnosis present

## 2020-09-23 NOTE — Therapy (Signed)
[place], [place], [address] Phone: [phone]   Fax:  [phone]  Physical Therapy Treatment  Patient Details  Name: Kara Kim MRN: [id number] Date of Birth: Mar 09, 1961 Referring Provider (PT): Dr.Toth   Encounter Date: 09/23/2020   PT End of Session - 09/23/20 1626    Visit Number 8    Number of Visits 13    Date for PT Re-Evaluation 09/30/20    PT Start Time 1500    PT Stop Time 1555    PT Time Calculation (min) 55 min    Activity Tolerance Patient tolerated treatment well    Behavior During Therapy [facility] for tasks assessed/performed           Past Medical History:  Diagnosis Date  . Anemia   . Breast cancer ([place])    right  . Complication of anesthesia   . Heart murmur    then another MD said no murmur  . Mitral valve disorders(424.0) 07/29/2013  . Osteopenia   . PONV (postoperative nausea and vomiting)   . Seasonal allergies   . Thyroid disease     Past Surgical History:  Procedure Laterality Date  . ANAL FISSURE REPAIR    . BREAST LUMPECTOMY WITH RADIOACTIVE SEED AND SENTINEL LYMPH NODE BIOPSY Right 02/19/2020   Procedure: RIGHT BREAST LUMPECTOMY X 2  WITH RADIOACTIVE SEED AND SENTINEL LYMPH NODE BIOPSY;  Surgeon: [name], MD;  Location: [place];  Service: General;  Laterality: Right;  . CESAREAN SECTION    . COLONOSCOPY    . eye lid lift    . tummy tuck      There were no vitals filed for this visit.   Subjective Assessment - 09/23/20 1513    Subjective Pt states that she has a massage yesterday.  She feels that she has a little swelling in her right axilla. She is doing her exercises at home. She got her gauntlets in the mail and is awaiting the sleeves. She states her rights shoulder is still tight and she does not have the range of motion she would like to have    Pertinent History Pt was diagnosed with malignanat neoplasm of upper outer quadrant of right breast  and underwent Right breast Lumpectomy with radioactive seed and SLNB on 02/19/20.  She had 1 month of radiation and is presently on tamoxifen.  She did not require chemotherapy.  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    Patient Stated Goals Improve shoulder ROM/decrease tightness    Currently in Pain? No/denies                             [facility] Adult PT Treatment/Exercise - 09/23/20 0001      Exercises   Exercises Shoulder;Other Exercises    Other Exercises  gave pt information about yoga exercises through [facility]       Shoulder Exercises: Prone   Other Prone Exercises upper thoracic exension     Other Prone Exercises "birddog" in quaduped position       Shoulder Exercises: Sidelying   Other Sidelying Exercises backward thoracic rotation and forward rotation with cues to move scapula       Shoulder Exercises: Standing   Retraction Strengthening;Right;Left;5 reps    Theraband Level (Shoulder Retraction) Level 1 (Yellow)    Retraction Limitations banded yellow loop around hands for backward elbow pulls with focus on scapular movement  Other Standing Exercises overhead flexion with towel held taut between hands to get thoracic extension       Manual Therapy   Manual Therapy Soft tissue mobilization;Scapular mobilization;Manual Lymphatic Drainage (MLD)    Manual therapy comments lined foam placed inside sports bra at lateral chest to see if that can decrease tightness     Soft tissue mobilization with coconut oil to pec major/ minor and deep work to along lateral scapular border in supine and sidelying  and to medial scapular border in SL    Myofascial Release right lateral trunk     Scapular Mobilization to right scapula in all planes    Passive ROM PROM Right shoulder with stretching into end ranges                    PT Short Term Goals - 08/19/20 1240      PT SHORT TERM GOAL #1   Title Pt will be independent in HEP for ROM and  strength of right shoulder    Time 3    Period Weeks    Status New    Target Date 09/09/20             PT Millirons Term Goals - 08/19/20 1241      PT Heinbaugh TERM GOAL #1   Title Pt will have AROM right shoulder flex and abd atleast  140 degrees for improved reaching ability    Baseline flex 125,  abd 110    Time 4    Period Weeks    Status New    Target Date 09/16/20      PT Decelle TERM GOAL #2   Title Pt will have AROM right shoulder WNL to resume usual household and community activities    Time 6    Period Weeks    Status New      PT Ruppel TERM GOAL #3   Title Pt will be educated in precautions to decrease lymphedema    Time 2    Period Weeks    Status New    Target Date 09/02/20      PT Masek TERM GOAL #4   Title Pt will attend ABC class for further education on precautions/ROM and lympedema    Time 3    Period Weeks    Status New      PT Vanschaick TERM GOAL #5   Title Pt will dress and bathe without limitation    Time 6    Period --    Status --    Target Date 09/16/20      Additional Codner Term Goals   Additional Knightly Term Goals Yes      PT Decelles TERM GOAL #6   Title Pts quick dash score will be 0 %                 Plan - 09/23/20 1622    Clinical Impression Statement Pt with very tight tissue at right posterior shoulder and lateral scapula that did not decrease significantly with soft tissue work today.  Tried to increase exercise in different positions for active scapular mobility. Also tried lined peach foam in bra to add compression to this area.    Comorbidities lumpectomy with SLNB, radiation    Examination-Activity Limitations Bathing;Dressing;Reach Overhead;Lift    Examination-Participation Restrictions Other    Stability/Clinical Decision Making Stable/Uncomplicated    Rehab Potential Excellent    PT Frequency 2x / week    PT Duration 6  weeks    PT Treatment/Interventions ADLs/Self Care Home Management;Neuromuscular re-education;Therapeutic  exercise;Therapeutic activities;Patient/family education;Manual techniques;Passive range of motion;Scar mobilization;Manual lymph drainage    PT Next Visit Plan focus on right lateral chest for soft tissue work and exercise, as well as stretching to shoulder as pt wants to regain full motion prior to discharge. MFR, joint mobs,PROM, update HEP, scar mobs, review precautions for Lymphedema, wearing of sleeve and donning/doffing when she gets sleeve, UE strength    Consulted and Agree with Plan of Care Patient           Patient will benefit from skilled therapeutic intervention in order to improve the following deficits and impairments:  Decreased range of motion, Increased fascial restricitons, Impaired UE functional use, Decreased knowledge of precautions, Decreased scar mobility, Decreased strength, Postural dysfunction  Visit Diagnosis: Stiffness of right shoulder, not elsewhere classified  Muscle weakness (generalized)  Aftercare following surgery for neoplasm     Problem List Patient Active Problem List   Diagnosis Date Noted  . Breast asymmetry 09/21/2020  . Malignant neoplasm of upper-outer quadrant of right breast in female, estrogen receptor positive ([place]) 01/07/2020  . Mitral valve disorders(424.0) 07/29/2013    Class: History of  . Elevated C-reactive protein (CRP) 07/29/2013    Class: Chronic   Donato Heinz. Owens Shark PT  Norwood Levo 09/23/2020, 4:26 PM  [place], [place], [address] Phone: [phone]   Fax:  [phone]  Name: Kara Kim MRN: [id number] Date of Birth: Jul 31, 1961

## 2020-09-27 ENCOUNTER — Telehealth: Payer: Self-pay | Admitting: Gastroenterology

## 2020-09-27 NOTE — Telephone Encounter (Signed)
Answered all of the patient's questions at this time. Encouraged the patient to keep taking senna nightly and miralax as directed daily leading up to he colonoscopy. Pt verbalizes understanding.

## 2020-09-27 NOTE — Telephone Encounter (Signed)
Inbound call from patient stating she ate a salad with raw veggies and nuts in it yesterday 09/26/20.  Also is currently taking Miralax ans Senna; wants to know if she can still keep taking those.

## 2020-09-28 ENCOUNTER — Ambulatory Visit

## 2020-09-28 ENCOUNTER — Encounter: Admitting: Gastroenterology

## 2020-09-28 ENCOUNTER — Other Ambulatory Visit: Payer: Self-pay

## 2020-09-28 DIAGNOSIS — Z483 Aftercare following surgery for neoplasm: Secondary | ICD-10-CM

## 2020-09-28 DIAGNOSIS — M25611 Stiffness of right shoulder, not elsewhere classified: Secondary | ICD-10-CM

## 2020-09-28 DIAGNOSIS — M6281 Muscle weakness (generalized): Secondary | ICD-10-CM

## 2020-09-28 NOTE — Therapy (Signed)
[place], [place], [address] Phone: [phone]   Fax:  [phone]  Physical Therapy Evaluation  Patient Details  Name: Kara Kim MRN: [id number] Date of Birth: 11-04-1960 Referring Provider (PT): Dr.Toth   Encounter Date: 09/28/2020   PT End of Session - 09/28/20 0355    Visit Number 9    Number of Visits 13    Date for PT Re-Evaluation 09/30/20    PT Start Time 9741    PT Stop Time 1456    PT Time Calculation (min) 45 min    Activity Tolerance Patient tolerated treatment well    Behavior During Therapy [facility] for tasks assessed/performed           Past Medical History:  Diagnosis Date  . Anemia   . Breast cancer ([place])    right  . Complication of anesthesia   . Heart murmur    then another MD said no murmur  . Mitral valve disorders(424.0) 07/29/2013  . Osteopenia   . PONV (postoperative nausea and vomiting)   . Seasonal allergies   . Thyroid disease     Past Surgical History:  Procedure Laterality Date  . ANAL FISSURE REPAIR    . BREAST LUMPECTOMY WITH RADIOACTIVE SEED AND SENTINEL LYMPH NODE BIOPSY Right 02/19/2020   Procedure: RIGHT BREAST LUMPECTOMY X 2  WITH RADIOACTIVE SEED AND SENTINEL LYMPH NODE BIOPSY;  Surgeon: [name], MD;  Location: [place];  Service: General;  Laterality: Right;  . CESAREAN SECTION    . COLONOSCOPY    . eye lid lift    . tummy tuck      There were no vitals filed for this visit.    Subjective Assessment - 09/28/20 1411    Subjective Got sleeves over the weekend, but I havent tried on yet.  Pt was asked to bring them next visit.  Shoulder is feeling a little better this week. Dont notice the swelling at axillary region as much this week.  My elbow has been hurting.  The foam does seem to have  softened things up.    Pertinent History Pt was diagnosed with malignanat neoplasm of upper outer quadrant of right breast and underwent Right breast  Lumpectomy with radioactive seed and SLNB on 02/19/20.  She had 1 month of radiation and is presently on tamoxifen.  She did not require chemotherapy.  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    Patient Stated Goals Improve shoulder ROM/decrease tightness    Currently in Pain? No/denies                          Objective measurements completed on examination: See above findings.       [facility] Adult PT Treatment/Exercise - 09/28/20 0001      Shoulder Exercises: Prone   Other Prone Exercises "birddog" in quaduped position    x 5 ea     Shoulder Exercises: Standing   Horizontal ABduction Strengthening;Both;10 reps    Theraband Level (Shoulder Horizontal ABduction) Level 1 (Yellow)    External Rotation Strengthening;Both;15 reps    Theraband Level (Shoulder External Rotation) Level 1 (Yellow)    Diagonals Strengthening;5 reps;Right;Left    Theraband Level (Shoulder Diagonals) Level 1 (Yellow)      Manual Therapy   Manual Therapy Soft tissue mobilization;Scapular mobilization;Manual Lymphatic Drainage (MLD)    Manual therapy comments Pt will place again when she gets home.  Soft tissue mobilization with coconut oil to pec major/ minor and deep work to along lateral scapular border in supine and sidelying  and to medial scapular border in SL    Myofascial Release right lateral trunk     Passive ROM PROM Right shoulder with stretching into end ranges                  PT Education - 09/28/20 1457    Education Details Reviewed warning signs of infection and ways to prevent lymphedema. Pt verbalized understanding    Person(s) Educated Patient    Methods Explanation    Comprehension Verbalized understanding            PT Short Term Goals - 09/28/20 1500      PT SHORT TERM GOAL #1   Title Pt will be independent in HEP for ROM and strength of right shoulder    Time 3    Period Weeks    Status Achieved             PT Grabbe Term Goals - 08/19/20  1241      PT Cheema TERM GOAL #1   Title Pt will have AROM right shoulder flex and abd atleast  140 degrees for improved reaching ability    Baseline flex 125,  abd 110    Time 4    Period Weeks    Status New    Target Date 09/16/20      PT Devereux TERM GOAL #2   Title Pt will have AROM right shoulder WNL to resume usual household and community activities    Time 6    Period Weeks    Status New      PT Fraleigh TERM GOAL #3   Title Pt will be educated in precautions to decrease lymphedema    Time 2    Period Weeks    Status New    Target Date 09/02/20      PT Proia TERM GOAL #4   Title Pt will attend ABC class for further education on precautions/ROM and lympedema    Time 3    Period Weeks    Status New      PT Villard TERM GOAL #5   Title Pt will dress and bathe without limitation    Time 6    Period --    Status --    Target Date 09/16/20      Additional Huneycutt Term Goals   Additional Narciso Term Goals Yes      PT Withington TERM GOAL #6   Title Pts quick dash score will be 0 %                  Plan - 09/28/20 1458    Clinical Impression Statement Pts shoulder feels looser this week and she has improved ROM.  Peach foam in bra did seem to help soften and loosen the area.  She received sleeves but did not bring with her today. Recert or DC next    Personal Factors and Comorbidities Comorbidity 2    Comorbidities lumpectomy with SLNB, radiation    Examination-Activity Limitations Bathing;Dressing;Reach Overhead;Lift    Stability/Clinical Decision Making Stable/Uncomplicated    Clinical Decision Making Low    Rehab Potential Excellent    PT Frequency 2x / week    PT Duration 6 weeks    PT Treatment/Interventions ADLs/Self Care Home Management;Neuromuscular re-education;Therapeutic exercise;Therapeutic activities;Patient/family education;Manual techniques;Passive range of motion;Scar mobilization;Manual lymph drainage  PT Next Visit Plan recert or DC next, check goals,  instruct donning/doffing of sleeve/gauntleu    PT Home Exercise Plan 4 post op exs supine AA flex and ER, standing lat stretch,  IR with strap, jobes flex and scaption, theraband SR, ext and bilateral ER standing rows    Consulted and Agree with Plan of Care Patient           Patient will benefit from skilled therapeutic intervention in order to improve the following deficits and impairments:  Decreased range of motion, Increased fascial restricitons, Impaired UE functional use, Decreased knowledge of precautions, Decreased scar mobility, Decreased strength, Postural dysfunction  Visit Diagnosis: Stiffness of right shoulder, not elsewhere classified  Muscle weakness (generalized)  Aftercare following surgery for neoplasm     Problem List Patient Active Problem List   Diagnosis Date Noted  . Breast asymmetry 09/21/2020  . Malignant neoplasm of upper-outer quadrant of right breast in female, estrogen receptor positive ([place]) 01/07/2020  . Mitral valve disorders(424.0) 07/29/2013    Class: History of  . Elevated C-reactive protein (CRP) 07/29/2013    Class: Chronic    Claris Pong 09/28/2020, 3:02 PM  [place], [place], [address] Phone: [phone]   Fax:  [phone]  Name: Kara Kim MRN: [id number] Date of Birth: 1961/04/23 Cheral Almas, PT 09/28/20 3:03 PM

## 2020-09-29 ENCOUNTER — Ambulatory Visit

## 2020-09-30 ENCOUNTER — Other Ambulatory Visit: Payer: Self-pay

## 2020-09-30 ENCOUNTER — Ambulatory Visit

## 2020-09-30 ENCOUNTER — Encounter: Admitting: Rehabilitation

## 2020-09-30 ENCOUNTER — Encounter: Admitting: Gastroenterology

## 2020-09-30 DIAGNOSIS — Z483 Aftercare following surgery for neoplasm: Secondary | ICD-10-CM

## 2020-09-30 DIAGNOSIS — M25611 Stiffness of right shoulder, not elsewhere classified: Secondary | ICD-10-CM

## 2020-09-30 DIAGNOSIS — M6281 Muscle weakness (generalized): Secondary | ICD-10-CM

## 2020-09-30 NOTE — Therapy (Signed)
[place], [place], [address] Phone: [phone]   Fax:  [phone]  Physical Therapy Treatment  Patient Details  Name: Kara Kim MRN: [id number] Date of Birth: Mar 22, 1961 Referring Provider (PT): Dr.Toth   Encounter Date: 09/30/2020   PT End of Session - 09/30/20 0944    Visit Number 10    Number of Visits 18    Date for PT Re-Evaluation 10/28/20    PT Start Time 0911    PT Stop Time 0948    PT Time Calculation (min) 37 min    Activity Tolerance Patient tolerated treatment well    Behavior During Therapy [facility] for tasks assessed/performed           Past Medical History:  Diagnosis Date  . Anemia   . Breast cancer ([place])    right  . Complication of anesthesia   . Heart murmur    then another MD said no murmur  . Mitral valve disorders(424.0) 07/29/2013  . Osteopenia   . PONV (postoperative nausea and vomiting)   . Seasonal allergies   . Thyroid disease     Past Surgical History:  Procedure Laterality Date  . ANAL FISSURE REPAIR    . BREAST LUMPECTOMY WITH RADIOACTIVE SEED AND SENTINEL LYMPH NODE BIOPSY Right 02/19/2020   Procedure: RIGHT BREAST LUMPECTOMY X 2  WITH RADIOACTIVE SEED AND SENTINEL LYMPH NODE BIOPSY;  Surgeon: [name], MD;  Location: [facility];  Service: General;  Laterality: Right;  . CESAREAN SECTION    . COLONOSCOPY    . eye lid lift    . tummy tuck      There were no vitals filed for this visit.   Subjective Assessment - 09/30/20 0914    Subjective Forgot to bring the sleeves today to have you check..  I would like to have a little more mobility back in my shoulder. I still feel very tight in the right chest axilla area and I really notice it when I reach behind my back or over my shoulder.    Patient Stated Goals Improve shoulder ROM/decrease tightness    Currently in Pain? No/denies    Pain Score 0-No pain    Pain Descriptors / Indicators Tightness               [facility] PT Assessment - 09/30/20 0001      AROM   Right Shoulder Extension 45 Degrees    Right Shoulder Flexion 153 Degrees    Right Shoulder ABduction 125 Degrees    Right Shoulder Internal Rotation 60 Degrees   T9 behind back   Right Shoulder External Rotation 90 Degrees             LYMPHEDEMA/ONCOLOGY QUESTIONNAIRE - 09/30/20 0001      Right Upper Extremity Lymphedema   15 cm Proximal to Olecranon Process 26.5 cm    Olecranon Process 22.9 cm    15 cm Proximal to Ulnar Styloid Process 21.6 cm    Just Proximal to Ulnar Styloid Process 13.8 cm    At Base of 2nd Digit 5.6 cm              Quick Dash - 09/30/20 0001    Open a tight or new jar No difficulty    Do heavy household chores (wash walls, wash floors) No difficulty    Carry a shopping bag or briefcase No difficulty    Wash your back Severe difficulty    Use a  knife to cut food No difficulty    Recreational activities in which you take some force or impact through your arm, shoulder, or hand (golf, hammering, tennis) Moderate difficulty    During the past week, to what extent has your arm, shoulder or hand problem interfered with your normal social activities with family, friends, neighbors, or groups? Not at all    During the past week, to what extent has your arm, shoulder or hand problem limited your work or other regular daily activities Slightly    Arm, shoulder, or hand pain. None    Tingling (pins and needles) in your arm, shoulder, or hand None    Difficulty Sleeping No difficulty    DASH Score 13.64 %                  [facility] Adult PT Treatment/Exercise - 09/30/20 0001      Shoulder Exercises: Standing   Other Standing Exercises IR with strap x 10      Shoulder Exercises: Pulleys   Flexion 2 minutes    Scaption 1 minute    ABduction 2 minutes      Shoulder Exercises: Therapy Ball   Flexion Both;5 reps    ABduction Right;10 reps                    PT Short Term  Goals - 09/30/20 0936      PT SHORT TERM GOAL #1   Title Pt will be independent in HEP for ROM and strength of right shoulder    Time 3    Period Weeks    Status Achieved             PT Ariel Term Goals - 09/30/20 0936      PT Down TERM GOAL #1   Baseline flex 153,  abd 125    Time 4    Period Weeks    Status Partially Met      PT Mcbane TERM GOAL #2   Title Pt will have AROM right shoulder WNL to resume usual household and community activities    Time 6    Period Weeks    Status On-going      PT Dunaj TERM GOAL #3   Title Pt will be educated in precautions to decrease lymphedema    Time 2    Period Weeks    Status Achieved      PT Obi TERM GOAL #4   Title Pt will attend ABC class for further education on precautions/ROM and lympedema    Time 3    Period Weeks    Status Achieved      PT Finder TERM GOAL #5   Baseline dressing is fine, bathing back is more limited    Time 6    Period Weeks    Status Partially Met      PT Seres TERM GOAL #6   Title Pts quick dash score will be 0 %    Period Weeks    Status On-going                 Plan - 09/30/20 0956    Clinical Impression Statement Pt has made some good improvements in ROM but continues to be most limited with true abduction and IR.  She feels tightness in the chest/axilla region and at her lateral trunk with extremes of reaching.  She is motivated and compliant.    Personal Factors and Comorbidities Comorbidity 2  Comorbidities lumpectomy with SLNB, radiation    Examination-Activity Limitations Bathing;Dressing;Reach Overhead;Lift    Stability/Clinical Decision Making Stable/Uncomplicated    Clinical Decision Making Low    Rehab Potential Excellent    PT Frequency 2x / week    PT Duration 4 weeks    PT Treatment/Interventions ADLs/Self Care Home Management;Neuromuscular re-education;Therapeutic exercise;Therapeutic activities;Patient/family education;Manual techniques;Passive range of motion;Scar  mobilization;Manual lymph drainage    PT Next Visit Plan Cont with emphasis on chest stretching, true abd , functional IR and ER reaching., STW to axilla area as needed.  Check sleeve if she remembers to bring    Consulted and Agree with Plan of Care Patient           Patient will benefit from skilled therapeutic intervention in order to improve the following deficits and impairments:  Decreased range of motion,Increased fascial restricitons,Impaired UE functional use,Decreased knowledge of precautions,Decreased scar mobility,Decreased strength,Postural dysfunction  Visit Diagnosis: Stiffness of right shoulder, not elsewhere classified  Muscle weakness (generalized)  Aftercare following surgery for neoplasm     Problem List Patient Active Problem List   Diagnosis Date Noted  . Breast asymmetry 09/21/2020  . Malignant neoplasm of upper-outer quadrant of right breast in female, estrogen receptor positive ([place]) 01/07/2020  . Mitral valve disorders(424.0) 07/29/2013    Class: History of  . Elevated C-reactive protein (CRP) 07/29/2013    Class: Chronic    Claris Pong 09/30/2020, 10:03 AM  [place], [place], [address] Phone: [phone]   Fax:  [phone]  Name: Kara Kim MRN: [id number] Date of Birth: 1961-08-08 Cheral Almas, PT 09/30/20 11:05 AM

## 2020-10-01 ENCOUNTER — Ambulatory Visit (AMBULATORY_SURGERY_CENTER): Admitting: Gastroenterology

## 2020-10-01 ENCOUNTER — Other Ambulatory Visit: Payer: Self-pay

## 2020-10-01 ENCOUNTER — Encounter: Payer: Self-pay | Admitting: Gastroenterology

## 2020-10-01 ENCOUNTER — Encounter: Admitting: Physical Therapy

## 2020-10-01 VITALS — BP 140/90 | HR 58 | Temp 97.8°F | Resp 14 | Ht 63.0 in | Wt 126.0 lb

## 2020-10-01 DIAGNOSIS — D120 Benign neoplasm of cecum: Secondary | ICD-10-CM

## 2020-10-01 DIAGNOSIS — Z1211 Encounter for screening for malignant neoplasm of colon: Secondary | ICD-10-CM

## 2020-10-01 DIAGNOSIS — D122 Benign neoplasm of ascending colon: Secondary | ICD-10-CM

## 2020-10-01 MED ORDER — SODIUM CHLORIDE 0.9 % IV SOLN: 500.0000 mL | Freq: Once | INTRAVENOUS | Status: DC

## 2020-10-01 NOTE — Progress Notes (Signed)
Report to PACU, RN, vss, BBS= Clear.  

## 2020-10-01 NOTE — Progress Notes (Signed)
Medical history reviewed with no changes noted. VS assessed by C.W 

## 2020-10-01 NOTE — Patient Instructions (Signed)
Handout given for polyps.  YOU HAD AN ENDOSCOPIC PROCEDURE TODAY AT THE [facility]:   Refer to the procedure report that was given to you for any specific questions about what was found during the examination.  If the procedure report does not answer your questions, please call your gastroenterologist to clarify.  If you requested that your care partner not be given the details of your procedure findings, then the procedure report has been included in a sealed envelope for you to review at your convenience later.  YOU SHOULD EXPECT: Some feelings of bloating in the abdomen. Passage of more gas than usual.  Walking can help get rid of the air that was put into your GI tract during the procedure and reduce the bloating. If you had a lower endoscopy (such as a colonoscopy or flexible sigmoidoscopy) you may notice spotting of blood in your stool or on the toilet paper. If you underwent a bowel prep for your procedure, you may not have a normal bowel movement for a few days.  Please Note:  You might notice some irritation and congestion in your nose or some drainage.  This is from the oxygen used during your procedure.  There is no need for concern and it should clear up in a day or so.  SYMPTOMS TO REPORT IMMEDIATELY:   Following lower endoscopy (colonoscopy or flexible sigmoidoscopy):  Excessive amounts of blood in the stool  Significant tenderness or worsening of abdominal pains  Swelling of the abdomen that is new, acute  Fever of 100F or higher  For urgent or emergent issues, a gastroenterologist can be reached at any hour by calling [phone]. Do not use MyChart messaging for urgent concerns.    DIET:  We do recommend a small meal at first, but then you may proceed to your regular diet.  Drink plenty of fluids but you should avoid alcoholic beverages for 24 hours.  ACTIVITY:  You should plan to take it easy for the rest of today and you should NOT DRIVE or use heavy  machinery until tomorrow (because of the sedation medicines used during the test).    FOLLOW UP: Our staff will call the number listed on your records 48-72 hours following your procedure to check on you and address any questions or concerns that you may have regarding the information given to you following your procedure. If we do not reach you, we will leave a message.  We will attempt to reach you two times.  During this call, we will ask if you have developed any symptoms of COVID 19. If you develop any symptoms (ie: fever, flu-like symptoms, shortness of breath, cough etc.) before then, please call [phone].  If you test positive for Covid 19 in the 2 weeks post procedure, please call and report this information to us.    If any biopsies were taken you will be contacted by phone or by letter within the next 1-3 weeks.  Please call us at [phone] if you have not heard about the biopsies in 3 weeks.    SIGNATURES/CONFIDENTIALITY: You and/or your care partner have signed paperwork which will be entered into your electronic medical record.  These signatures attest to the fact that that the information above on your After Visit Summary has been reviewed and is understood.  Full responsibility of the confidentiality of this discharge information lies with you and/or your care-partner. 

## 2020-10-01 NOTE — Op Note (Addendum)
[place] Patient Name: Kara Kim Procedure Date: 10/01/2020 2:31 PM MRN: [id number] Endoscopist: Thornton Park MD, MD Age: 59 Referring MD:  Date of Birth: 1961-04-21 Gender: Female Account #: [id number] Procedure:                Colonoscopy Indications:              Screening for colorectal malignant neoplasm                           Colonoscopy with Dr. Paulita Fujita 2012, prior colonoscopy                            at age 45 Medicines:                Monitored Anesthesia Care Procedure:                Pre-Anesthesia Assessment:                           - Prior to the procedure, a History and Physical                            was performed, and patient medications and                            allergies were reviewed. The patient's tolerance of                            previous anesthesia was also reviewed. The risks                            and benefits of the procedure and the sedation                            options and risks were discussed with the patient.                            All questions were answered, and informed consent                            was obtained. Prior Anticoagulants: The patient has                            taken no previous anticoagulant or antiplatelet                            agents. ASA Grade Assessment: II - A patient with                            mild systemic disease. After reviewing the risks                            and benefits, the patient was deemed in  satisfactory condition to undergo the procedure.                           After obtaining informed consent, the colonoscope                            was passed under direct vision. Throughout the                            procedure, the patient's blood pressure, pulse, and                            oxygen saturations were monitored continuously. The                            Colonoscope was introduced through the anus and                             advanced to the 3 cm into the ileum. The                            colonoscopy was performed without difficulty. The                            patient tolerated the procedure well. The quality                            of the bowel preparation was good. The terminal                            ileum, ileocecal valve, appendiceal orifice, and                            rectum were photographed. Scope In: 2:49:33 PM Scope Out: 3:05:04 PM Scope Withdrawal Time: 0 hours 10 minutes 9 seconds  Total Procedure Duration: 0 hours 15 minutes 31 seconds  Findings:                 The perianal and digital rectal examinations were                            normal.                           Four flat polyps were found in the ascending colon                            and cecum. The polyps were 1 mm in size. I                            attempted to snare the polyps, but, the snare would                            slide over the polyps. These polyps were ultimately  removed with a cold biopsy forceps. Resection and                            retrieval were complete. Estimated blood loss was                            minimal.                           The exam was otherwise without abnormality on                            direct and retroflexion views. Complications:            No immediate complications. Estimated blood loss:                            Minimal. Estimated Blood Loss:     Estimated blood loss was minimal. Impression:               - Four 1 mm polyps in the ascending colon and in                            the cecum, removed with a cold biopsy forceps.                            Resected and retrieved.                           - The examination was otherwise normal on direct                            and retroflexion views. Recommendation:           - Patient has a contact number available for                            emergencies.  The signs and symptoms of potential                            delayed complications were discussed with the                            patient. Return to normal activities tomorrow.                            Written discharge instructions were provided to the                            patient.                           - Resume previous diet.                           - Continue present medications.                           -  Await pathology results.                           - Repeat colonoscopy date to be determined after                            pending pathology results are reviewed for                            surveillance.                           - Emerging evidence supports eating a diet of                            fruits, vegetables, grains, calcium, and yogurt                            while reducing red meat and alcohol may reduce the                            risk of colon cancer.                           - Thank you for allowing me to be involved in your                            colon cancer prevention. Thornton Park MD, MD 10/01/2020 3:11:54 PM This report has been signed electronically.

## 2020-10-05 ENCOUNTER — Telehealth: Payer: Self-pay | Admitting: *Deleted

## 2020-10-05 ENCOUNTER — Ambulatory Visit

## 2020-10-05 ENCOUNTER — Other Ambulatory Visit: Payer: Self-pay

## 2020-10-05 DIAGNOSIS — M25611 Stiffness of right shoulder, not elsewhere classified: Secondary | ICD-10-CM

## 2020-10-05 DIAGNOSIS — M6281 Muscle weakness (generalized): Secondary | ICD-10-CM

## 2020-10-05 DIAGNOSIS — Z483 Aftercare following surgery for neoplasm: Secondary | ICD-10-CM

## 2020-10-05 NOTE — Therapy (Addendum)
[place], [place], [address] Phone: [phone]   Fax:  [phone]  Physical Therapy Treatment  Patient Details  Name: Kara Kim MRN: [id number] Date of Birth: 1960/11/20 Referring Provider (PT): Dr.Toth   Encounter Date: 10/05/2020   PT End of Session - 10/05/20 1005    Visit Number 11    Number of Visits 18    Date for PT Re-Evaluation 10/28/20    PT Start Time 0906    PT Stop Time 0955    PT Time Calculation (min) 49 min    Activity Tolerance Patient tolerated treatment well    Behavior During Therapy [facility] for tasks assessed/performed           Past Medical History:  Diagnosis Date  . Anemia   . Breast cancer ([place])    right  . Complication of anesthesia   . Heart murmur    then another MD said no murmur  . Mitral valve disorders(424.0) 07/29/2013  . Osteopenia   . PONV (postoperative nausea and vomiting)   . Seasonal allergies   . Thyroid disease     Past Surgical History:  Procedure Laterality Date  . ANAL FISSURE REPAIR    . BREAST LUMPECTOMY WITH RADIOACTIVE SEED AND SENTINEL LYMPH NODE BIOPSY Right 02/19/2020   Procedure: RIGHT BREAST LUMPECTOMY X 2  WITH RADIOACTIVE SEED AND SENTINEL LYMPH NODE BIOPSY;  Surgeon: [name], MD;  Location: [facility];  Service: General;  Laterality: Right;  . CESAREAN SECTION    . COLONOSCOPY    . eye lid lift    . tummy tuck      There were no vitals filed for this visit.   Subjective Assessment - 10/05/20 0906    Subjective Right shoulder is still a little tight at end motions.  No pain today.  I brought my sleeve and gauntlet to check.  I also purchased a couple of compression bras at [employer].    Pertinent History Pt was diagnosed with malignanat neoplasm of upper outer quadrant of right breast and underwent Right breast Lumpectomy with radioactive seed and SLNB on 02/19/20.  She had 1 month of radiation and is presently on  tamoxifen.  She did not require chemotherapy.  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    Patient Stated Goals Improve shoulder ROM/decrease tightness    Currently in Pain? --    Pain Score 0-No pain                             [facility] Adult PT Treatment/Exercise - 10/05/20 0001      Shoulder Exercises: Supine   Other Supine Exercises supine flexion and abd on blue roll in thoracic region 2 x 5    Other Supine Exercises supine snow angel x 10      Shoulder Exercises: Standing   Other Standing Exercises functional IR and ER x reaching 10 , Post capsule stretch x 3     Manual Therapy   Manual therapy comments Pt instructed how to don/doff class 1 compression sleeve and gauntlet and instructed in its use    Joint Mobilization Gr 2/3 post/inf GH mobilizations    Myofascial Release right lateral trunk during stretching     Passive ROM PROM Right shoulder with stretching into end ranges  PT Education - 10/05/20 1001    Education Details Pt was instructed in proper way to don/doff prophylactic compression sleeve and gauntlet.  She was advised to try wearing an hour or so without gauntlet and to assess hand for swelling, but also to wear gauntlet with sleeve to be sure it fits OK.  She will wear for times when she is very active or when flying. Also advised pt to work on lying supine over towel roll or foam roll and perform shoulder flex and horizontal abd activities to help stretch shoulder and fascia.  Adivsed to perform functional reaching for IR and ER to improve AROM    Person(s) Educated Patient    Methods Explanation;Demonstration    Comprehension Returned demonstration;Verbalized understanding            PT Short Term Goals - 09/30/20 0936      PT SHORT TERM GOAL #1   Title Pt will be independent in HEP for ROM and strength of right shoulder    Time 3    Period Weeks    Status Achieved             PT [place]icz Term Goals -  10/05/20 1012      PT Meharg TERM GOAL #1   Title Pt will have AROM right shoulder flex and abd atleast  140 degrees for improved reaching ability    Baseline flex 153,  abd 125    Time 4    Period Weeks    Status Partially Met      PT Tersigni TERM GOAL #2   Title Pt will have AROM right shoulder WNL to resume usual household and community activities    Time 6    Period Weeks    Status On-going      PT Lok TERM GOAL #3   Title Pt will be educated in precautions to decrease lymphedema    Time 2    Status Achieved      PT Gauger TERM GOAL #4   Title Pt will attend ABC class for further education on precautions/ROM and lympedema    Period Weeks    Status Achieved      PT Gikas TERM GOAL #5   Title Pt will dress and bathe without limitation    Baseline dressing is fine, bathing back is more limited    Time 6    Period Weeks    Status Partially Met      PT Campo TERM GOAL #6   Title Pts quick dash score will be 0 %    Period Weeks    Status On-going                 Plan - 10/05/20 1009    Personal Factors and Comorbidities Comorbidity 2    Comorbidities lumpectomy with SLNB, radiation    Examination-Activity Limitations Bathing;Dressing;Reach Overhead;Lift    Examination-Participation Restrictions Other    Stability/Clinical Decision Making Stable/Uncomplicated    Clinical Decision Making Low    Rehab Potential Excellent    PT Frequency 2x / week    PT Duration 4 weeks    PT Treatment/Interventions ADLs/Self Care Home Management;Neuromuscular re-education;Therapeutic exercise;Therapeutic activities;Patient/family education;Manual techniques;Passive range of motion;Scar mobilization;Manual lymph drainage    PT Next Visit Plan Cont with emphasis on chest stretching, true abd , functional IR and ER reaching., STW to axilla area as needed. exs on foam roll in thoracic region,  Reiterate times to wear sleeve/gauntlet    PT Home  Exercise Plan 4 post op exs supine AA flex and  ER, standing lat stretch,  IR with strap, jobes flex and scaption, theraband SR, ext and bilateral ER standing rows, functional IR and ER reaching    Consulted and Agree with Plan of Care Patient           Patient will benefit from skilled therapeutic intervention in order to improve the following deficits and impairments:  Decreased range of motion,Increased fascial restricitons,Impaired UE functional use,Decreased knowledge of precautions,Decreased scar mobility,Decreased strength,Postural dysfunction  Visit Diagnosis: Stiffness of right shoulder, not elsewhere classified  Muscle weakness (generalized)  Aftercare following surgery for neoplasm     Problem List Patient Active Problem List   Diagnosis Date Noted  . Breast asymmetry 09/21/2020  . Malignant neoplasm of upper-outer quadrant of right breast in female, estrogen receptor positive ([place]) 01/07/2020  . Mitral valve disorders(424.0) 07/29/2013    Class: History of  . Elevated C-reactive protein (CRP) 07/29/2013    Class: Chronic    Claris Pong 10/05/2020, 10:15 AM  [place], [place], [address] Phone: [phone]   Fax:  [phone]  Name: Verleen Stuckey MRN: [id number] Date of Birth: 02/27/1961 Cheral Almas, PT 10/05/20 10:18 AM Cheral Almas, PT 10/07/20 7:25 AM

## 2020-10-05 NOTE — Telephone Encounter (Signed)
  Follow up Call-  Call back number 10/01/2020  Post procedure Call Back phone  # [phone]  Permission to leave phone message Yes  Some recent data might be hidden     Patient questions:  Do you have a fever, pain , or abdominal swelling? No. Pain Score  0 *  Have you tolerated food without any problems? Yes.    Have you been able to return to your normal activities? Yes.    Do you have any questions about your discharge instructions: Diet   No. Medications  No. Follow up visit  No.  Do you have questions or concerns about your Care? No.  Actions: * If pain score is 4 or above: No action needed, pain <4.  1. Have you developed a fever since your procedure? no  2.   Have you had an respiratory symptoms (SOB or cough) since your procedure? no  3.   Have you tested positive for COVID 19 since your procedure no  4.   Have you had any family members/close contacts diagnosed with the COVID 19 since your procedure?  no   If yes to any of these questions please route to Joylene John, RN and Joella Prince, RN

## 2020-10-07 ENCOUNTER — Other Ambulatory Visit: Payer: Self-pay

## 2020-10-07 ENCOUNTER — Ambulatory Visit

## 2020-10-07 ENCOUNTER — Encounter: Payer: Self-pay | Admitting: Gastroenterology

## 2020-10-07 DIAGNOSIS — M25611 Stiffness of right shoulder, not elsewhere classified: Secondary | ICD-10-CM

## 2020-10-07 DIAGNOSIS — M6281 Muscle weakness (generalized): Secondary | ICD-10-CM

## 2020-10-07 DIAGNOSIS — Z483 Aftercare following surgery for neoplasm: Secondary | ICD-10-CM

## 2020-10-07 NOTE — Therapy (Signed)
[place], [place], [address] Phone: [phone]   Fax:  [phone]  Physical Therapy Treatment  Patient Details  Name: Kara Kim MRN: [id number] Date of Birth: 07-Nov-1960 Referring Provider (PT): Dr.Toth   Encounter Date: 10/07/2020   PT End of Session - 10/07/20 1519    Visit Number 12    Number of Visits 18    Date for PT Re-Evaluation 10/28/20    PT Start Time 1504    PT Stop Time 1551    PT Time Calculation (min) 47 min    Activity Tolerance Patient tolerated treatment well    Behavior During Therapy [facility] for tasks assessed/performed           Past Medical History:  Diagnosis Date  . Anemia   . Breast cancer ([place])    right  . Complication of anesthesia   . Heart murmur    then another MD said no murmur  . Mitral valve disorders(424.0) 07/29/2013  . Osteopenia   . PONV (postoperative nausea and vomiting)   . Seasonal allergies   . Thyroid disease     Past Surgical History:  Procedure Laterality Date  . ANAL FISSURE REPAIR    . BREAST LUMPECTOMY WITH RADIOACTIVE SEED AND SENTINEL LYMPH NODE BIOPSY Right 02/19/2020   Procedure: RIGHT BREAST LUMPECTOMY X 2  WITH RADIOACTIVE SEED AND SENTINEL LYMPH NODE BIOPSY;  Surgeon: [name], MD;  Location: [place];  Service: General;  Laterality: Right;  . CESAREAN SECTION    . COLONOSCOPY    . eye lid lift    . tummy tuck      There were no vitals filed for this visit.   Subjective Assessment - 10/07/20 1503    Subjective Doesn't feel too too tight today. my right rib cage feels tender to touch    Pertinent History Pt was diagnosed with malignanat neoplasm of upper outer quadrant of right breast and underwent Right breast Lumpectomy with radioactive seed and SLNB on 02/19/20.  She had 1 month of radiation and is presently on tamoxifen.  She did not require chemotherapy.  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    Patient  Stated Goals Improve shoulder ROM/decrease tightness    Currently in Pain? No/denies    Pain Score 0-No pain                             [facility] Adult PT Treatment/Exercise - 10/07/20 0001      Shoulder Exercises: Supine   Horizontal ABduction 15 reps    Theraband Level (Shoulder Horizontal ABduction) Level 1 (Yellow)    External Rotation 15 reps    Theraband Level (Shoulder External Rotation) Level 1 (Yellow)    Diagonals Strengthening;Right;Left;10 reps    Theraband Level (Shoulder Diagonals) Level 1 (Yellow)    Other Supine Exercises supine flexion and abd on blue 1/2 roll in thoracic region 2 x 5    Other Supine Exercises supine snow angel x 10      Shoulder Exercises: Standing   Other Standing Exercises functional IR and ER x 10      Shoulder Exercises: Stretch   Other Shoulder Stretches IR stretch with strap x 10                   PT Short Term Goals - 09/30/20 0936      PT SHORT TERM GOAL #1  Title Pt will be independent in HEP for ROM and strength of right shoulder    Time 3    Period Weeks    Status Achieved             PT Schack Term Goals - 10/05/20 1012      PT Mccuen TERM GOAL #1   Title Pt will have AROM right shoulder flex and abd atleast  140 degrees for improved reaching ability    Baseline flex 153,  abd 125    Time 4    Period Weeks    Status Partially Met      PT Howse TERM GOAL #2   Title Pt will have AROM right shoulder WNL to resume usual household and community activities    Time 6    Period Weeks    Status On-going      PT Wandersee TERM GOAL #3   Title Pt will be educated in precautions to decrease lymphedema    Time 2    Status Achieved      PT Maggi TERM GOAL #4   Title Pt will attend ABC class for further education on precautions/ROM and lympedema    Period Weeks    Status Achieved      PT Sermon TERM GOAL #5   Title Pt will dress and bathe without limitation    Baseline dressing is fine, bathing back is more  limited    Time 6    Period Weeks    Status Partially Met      PT Blatter TERM GOAL #6   Title Pts quick dash score will be 0 %    Period Weeks    Status On-going                 Plan - 10/07/20 1551    Clinical Impression Statement Pt continues with pulling at right lateral trunk with PROM of the right shoulder.  She requires VC's to keep her back down when exercising on 1/2 roll. Still a little tight at end ranges of motion but overall improved.  Reiterated wearing sleeve a few hours at a time to get use to it.  Pt is going skiing in [place] in a few days and will wear sleeve.    Personal Factors and Comorbidities Comorbidity 2    Comorbidities lumpectomy with SLNB, radiation    Examination-Activity Limitations Bathing;Dressing;Reach Overhead;Lift    Examination-Participation Restrictions Other    Stability/Clinical Decision Making Stable/Uncomplicated    Rehab Potential Excellent    PT Frequency 2x / week    PT Duration 4 weeks    PT Treatment/Interventions ADLs/Self Care Home Management;Neuromuscular re-education;Therapeutic exercise;Therapeutic activities;Patient/family education;Manual techniques;Passive range of motion;Scar mobilization;Manual lymph drainage    PT Next Visit Plan Cont with emphasis on chest stretching, true abd , functional IR and ER reaching., STW to axilla area as needed. , PROM    PT Home Exercise Plan 4 post op exs supine AA flex and ER, standing lat stretch,  IR with strap, jobes flex and scaption, theraband SR, ext and bilateral ER standing rows, functional IR and ER reaching    Consulted and Agree with Plan of Care Patient           Patient will benefit from skilled therapeutic intervention in order to improve the following deficits and impairments:  Decreased range of motion,Increased fascial restricitons,Impaired UE functional use,Decreased knowledge of precautions,Decreased scar mobility,Decreased strength,Postural dysfunction  Visit  Diagnosis: Stiffness of right shoulder, not elsewhere classified  Muscle weakness (generalized)  Aftercare following surgery for neoplasm     Problem List Patient Active Problem List   Diagnosis Date Noted  . Breast asymmetry 09/21/2020  . Malignant neoplasm of upper-outer quadrant of right breast in female, estrogen receptor positive ([place]) 01/07/2020  . Mitral valve disorders(424.0) 07/29/2013    Class: History of  . Elevated C-reactive protein (CRP) 07/29/2013    Class: Chronic    Claris Pong 10/07/2020, 3:55 PM  [place], [place], [address] Phone: [phone]   Fax:  [phone]  Name: Glenys Snader MRN: [id number] Date of Birth: 07/09/1961 Cheral Almas, PT 10/07/20 3:56 PM

## 2020-10-11 ENCOUNTER — Telehealth: Payer: Self-pay | Admitting: Plastic Surgery

## 2020-10-11 NOTE — Telephone Encounter (Signed)
Called patient to inquire about surgery scheduling timeline. The patient has not made a definitive decision as of yet, but she will call back once she is ready to proceed.

## 2020-10-14 ENCOUNTER — Ambulatory Visit: Admitting: Physical Therapy

## 2020-10-20 ENCOUNTER — Ambulatory Visit: Admitting: Physical Therapy

## 2020-10-20 ENCOUNTER — Other Ambulatory Visit: Payer: Self-pay

## 2020-10-20 DIAGNOSIS — M25611 Stiffness of right shoulder, not elsewhere classified: Secondary | ICD-10-CM | POA: Diagnosis not present

## 2020-10-20 DIAGNOSIS — M6281 Muscle weakness (generalized): Secondary | ICD-10-CM

## 2020-10-20 DIAGNOSIS — Z483 Aftercare following surgery for neoplasm: Secondary | ICD-10-CM

## 2020-10-20 NOTE — Therapy (Signed)
[place], [place], [address] Phone: [phone]   Fax:  [phone]  Physical Therapy Treatment  Patient Details  Name: Kara Kim MRN: [id number] Date of Birth: July 20, 1961 Referring Provider (PT): Dr.Toth   Encounter Date: 10/20/2020   PT End of Session - 10/20/20 1425    Visit Number 13    Number of Visits 18    Date for PT Re-Evaluation 10/28/20    PT Start Time [id number]    PT Stop Time 1400    PT Time Calculation (min) 55 min    Activity Tolerance Patient tolerated treatment well           Past Medical History:  Diagnosis Date  . Anemia   . Breast cancer ([place])    right  . Complication of anesthesia   . Heart murmur    then another MD said no murmur  . Mitral valve disorders(424.0) 07/29/2013  . Osteopenia   . PONV (postoperative nausea and vomiting)   . Seasonal allergies   . Thyroid disease     Past Surgical History:  Procedure Laterality Date  . ANAL FISSURE REPAIR    . BREAST LUMPECTOMY WITH RADIOACTIVE SEED AND SENTINEL LYMPH NODE BIOPSY Right 02/19/2020   Procedure: RIGHT BREAST LUMPECTOMY X 2  WITH RADIOACTIVE SEED AND SENTINEL LYMPH NODE BIOPSY;  Surgeon: [name], MD;  Location: [place];  Service: General;  Laterality: Right;  . CESAREAN SECTION    . COLONOSCOPY    . eye lid lift    . tummy tuck      There were no vitals filed for this visit.   Subjective Assessment - 10/20/20 1410    Subjective Pt says she feels that her arm feels better. She still has some tigthness in her armpit and down her side    Pertinent History Pt was diagnosed with malignanat neoplasm of upper outer quadrant of right breast and underwent Right breast Lumpectomy with radioactive seed and SLNB on 02/19/20.  She had 1 month of radiation and is presently on tamoxifen.  She did not require chemotherapy.  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    Patient Stated Goals Improve shoulder  ROM/decrease tightness    Currently in Pain? No/denies              [facility] PT Assessment - 10/20/20 0001      Observation/Other Assessments   Other Surveys  Quick Dash    Quick DASH  2.27      AROM   Right Shoulder Flexion 160 Degrees    Right Shoulder ABduction 155 Degrees                 Quick Dash - 10/20/20 0001    Open a tight or new jar No difficulty    Do heavy household chores (wash walls, wash floors) No difficulty    Carry a shopping bag or briefcase No difficulty    Wash your back Mild difficulty    Use a knife to cut food No difficulty    Recreational activities in which you take some force or impact through your arm, shoulder, or hand (golf, hammering, tennis) No difficulty    During the past week, to what extent has your arm, shoulder or hand problem interfered with your normal social activities with family, friends, neighbors, or groups? Not at all    During the past week, to what extent has your arm, shoulder or hand  problem limited your work or other regular daily activities Not at all    Arm, shoulder, or hand pain. None    Tingling (pins and needles) in your arm, shoulder, or hand None    Difficulty Sleeping No difficulty    DASH Score 2.27 %                  [facility] Adult PT Treatment/Exercise - 10/20/20 0001      Exercises   Exercises Other Exercises    Other Exercises  issued Strength ABC program and verbally reviewed. Pt very familiar with exercises as she is an experienced exerciser.   Demonstrated UE stretches      Manual Therapy   Soft tissue mobilization with coconut oil for deep work to along lateral scapular border in supine and sidelying  and to medial scapular border in SL    Myofascial Release right lateral trunk     Scapular Mobilization to right scapula in all planes    Passive ROM PROM Right shoulder with stretching into end ranges                    PT Short Term Goals - 09/30/20 0936      PT SHORT TERM GOAL  #1   Title Pt will be independent in HEP for ROM and strength of right shoulder    Time 3    Period Weeks    Status Achieved             PT Peerson Term Goals - 10/20/20 1428      PT Oestreicher TERM GOAL #1   Title Pt will have AROM right shoulder flex and abd atleast  140 degrees for improved reaching ability    Status Achieved      PT Semper TERM GOAL #2   Title Pt will have AROM right shoulder WNL to resume usual household and community activities    Status Achieved      PT Geske TERM GOAL #3   Title Pt will be educated in precautions to decrease lymphedema    Status Achieved      PT Dull TERM GOAL #4   Title Pt will attend ABC class for further education on precautions/ROM and lympedema    Status Achieved      PT Musto TERM GOAL #5   Title Pt will dress and bathe without limitation    Status Achieved      PT Reininger TERM GOAL #6   Title Pts quick dash score will be 0 %    Baseline 10/20/2020: Quick DASH is 2.27    Time 4    Period Weeks    Status On-going                 Plan - 10/20/20 1425    Clinical Impression Statement Pt has improved ROM of shoulder and has almost achieved all goals ( still limited on Quick DASH washing her back.)  she has tightness in right lateral border of scapula and will benefit from continued PT sessions next week for manual work on this.  Likely ready to discharge on Jan 6 at end of cert period    Personal Factors and Comorbidities Comorbidity 2    Comorbidities lumpectomy with SLNB, radiation    Examination-Activity Limitations Bathing;Dressing;Reach Overhead;Lift    Stability/Clinical Decision Making Stable/Uncomplicated    Rehab Potential Excellent    PT Frequency 2x / week    PT Duration 4 weeks  PT Treatment/Interventions ADLs/Self Care Home Management;Neuromuscular re-education;Therapeutic exercise;Therapeutic activities;Patient/family education;Manual techniques;Passive range of motion;Scar mobilization;Manual lymph drainage    PT  Next Visit Plan Cont with emphasis on chest stretching, true abd , functional IR and ER reaching., STW to axilla area as needed. , PROM  if pt dishcharged on Jan 6, make sure to cancel remaining appts.    PT Home Exercise Plan 4 post op exs supine AA flex and ER, standing lat stretch,  IR with strap, jobes flex and scaption, theraband SR, ext and bilateral ER standing rows, functional IR and ER reaching    Consulted and Agree with Plan of Care Patient           Patient will benefit from skilled therapeutic intervention in order to improve the following deficits and impairments:  Decreased range of motion,Increased fascial restricitons,Impaired UE functional use,Decreased knowledge of precautions,Decreased scar mobility,Decreased strength,Postural dysfunction  Visit Diagnosis: Stiffness of right shoulder, not elsewhere classified  Muscle weakness (generalized)  Aftercare following surgery for neoplasm     Problem List Patient Active Problem List   Diagnosis Date Noted  . Breast asymmetry 09/21/2020  . Malignant neoplasm of upper-outer quadrant of right breast in female, estrogen receptor positive ([facility]) 01/07/2020  . Mitral valve disorders(424.0) 07/29/2013    Class: History of  . Elevated C-reactive protein (CRP) 07/29/2013    Class: Chronic   Bayard Hugger. Manson Passey PT  Donnetta Hail 10/20/2020, 2:30 PM  [facility] [address] [place], [place], [address] Phone: [phone]   Fax:  [phone]  Name: Kara Kim MRN: [id number] Date of Birth: 07-28-1961

## 2020-10-26 ENCOUNTER — Encounter: Admitting: Physical Therapy

## 2020-10-28 ENCOUNTER — Encounter

## 2020-11-02 ENCOUNTER — Encounter

## 2020-11-04 ENCOUNTER — Encounter: Payer: Self-pay | Admitting: Physical Therapy

## 2020-11-04 ENCOUNTER — Other Ambulatory Visit: Payer: Self-pay

## 2020-11-04 ENCOUNTER — Ambulatory Visit: Attending: General Surgery | Admitting: Physical Therapy

## 2020-11-04 DIAGNOSIS — M25611 Stiffness of right shoulder, not elsewhere classified: Secondary | ICD-10-CM | POA: Insufficient documentation

## 2020-11-04 DIAGNOSIS — Z483 Aftercare following surgery for neoplasm: Secondary | ICD-10-CM | POA: Diagnosis present

## 2020-11-04 NOTE — Therapy (Signed)
[place], [place], [address] Phone: [phone]   Fax:  [phone]  Physical Therapy Treatment  Patient Details  Name: Kara Kim MRN: [id number] Date of Birth: 07/11/1961 Referring Provider (PT): Dr.Toth   Encounter Date: 11/04/2020   PT End of Session - 11/04/20 9292    Visit Number 14    Number of Visits 18    Date for PT Re-Evaluation 10/28/20    PT Start Time 1600    PT Stop Time 1645    PT Time Calculation (min) 45 min    Activity Tolerance Patient tolerated treatment well    Behavior During Therapy [facility] for tasks assessed/performed           Past Medical History:  Diagnosis Date  . Anemia   . Breast cancer ([place])    right  . Complication of anesthesia   . Heart murmur    then another MD said no murmur  . Mitral valve disorders(424.0) 07/29/2013  . Osteopenia   . PONV (postoperative nausea and vomiting)   . Seasonal allergies   . Thyroid disease     Past Surgical History:  Procedure Laterality Date  . ANAL FISSURE REPAIR    . BREAST LUMPECTOMY WITH RADIOACTIVE SEED AND SENTINEL LYMPH NODE BIOPSY Right 02/19/2020   Procedure: RIGHT BREAST LUMPECTOMY X 2  WITH RADIOACTIVE SEED AND SENTINEL LYMPH NODE BIOPSY;  Surgeon: [name], MD;  Location: [place];  Service: General;  Laterality: Right;  . CESAREAN SECTION    . COLONOSCOPY    . eye lid lift    . tummy tuck      There were no vitals filed for this visit.   Subjective Assessment - 11/04/20 1602    Subjective Pt states she is doing well and feels like she is ready to discharge . She went for a massage earlier this week and still feels like she is a little "tight'  she feels like it is a little better than last time.  She is doing exercises at home and is looking forward to getting back to the gym once she feels safe in regards to Covid    Pertinent History Pt was diagnosed with malignanat neoplasm of upper outer  quadrant of right breast and underwent Right breast Lumpectomy with radioactive seed and SLNB on 02/19/20.  She had 1 month of radiation and is presently on tamoxifen.  She did not require chemotherapy.  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    Patient Stated Goals Improve shoulder ROM/decrease tightness    Currently in Pain? No/denies                             [facility] Adult PT Treatment/Exercise - 11/04/20 0001      Manual Therapy   Soft tissue mobilization with coconut oil for deep work to along lateral scapular border in supine and sidelying  and to medial scapular border in SL    Myofascial Release right lateral trunk     Scapular Mobilization to right scapula in all planes    Passive ROM PROM Right shoulder with stretching into end ranges                    PT Short Term Goals - 09/30/20 0936      PT SHORT TERM GOAL #1   Title Pt will be independent in HEP for ROM  and strength of right shoulder    Time 3    Period Weeks    Status Achieved             PT L[place]ie Term Goals - 11/04/20 1741      PT Zuelke TERM GOAL #1   Title Pt will have AROM right shoulder flex and abd atleast  140 degrees for improved reaching ability    Status Achieved      PT Stege TERM GOAL #2   Title Pt will have AROM right shoulder WNL to resume usual household and community activities    Status Achieved      PT Sedita TERM GOAL #3   Title Pt will be educated in precautions to decrease lymphedema    Status Achieved      PT Causey TERM GOAL #4   Title Pt will attend ABC class for further education on precautions/ROM and lympedema    Status Achieved      PT Nase TERM GOAL #5   Title Pt will dress and bathe without limitation    Status Achieved      PT Boddy TERM GOAL #6   Title Pts quick dash score will be 0 %    Baseline 10/20/2020: Quick DASH is 2.27    Status Partially Met                 Plan - 11/04/20 1737    Clinical Impression Statement Pt is  improved and ready to discharge from PT She still has some tightness around her right scapula , but feels like it is getting better.  She will continue to get massages and do exercises and strething at home.    PT Next Visit Plan discharge this episode           Patient will benefit from skilled therapeutic intervention in order to improve the following deficits and impairments:  Decreased range of motion,Increased fascial restricitons,Impaired UE functional use,Decreased knowledge of precautions,Decreased scar mobility,Decreased strength,Postural dysfunction  Visit Diagnosis: Aftercare following surgery for neoplasm  Stiffness of right shoulder, not elsewhere classified     Problem List Patient Active Problem List   Diagnosis Date Noted  . Breast asymmetry 09/21/2020  . Malignant neoplasm of upper-outer quadrant of right breast in female, estrogen receptor positive ([place]) 01/07/2020  . Mitral valve disorders(424.0) 07/29/2013    Class: History of  . Elevated C-reactive protein (CRP) 07/29/2013    Class: Chronic    PHYSICAL THERAPY DISCHARGE SUMMARY  Visits from Start of Care: 14  Current functional level related to goals / functional outcomes: Pt independent  Remaining deficits: Some tightness around right scapula   Education / Equipment: Home exericise Plan: Patient agrees to discharge.  Patient goals were partially met. Patient is being discharged due to being pleased with the current functional level.  ?????     Donato Heinz. Owens Shark PT  Norwood Levo 11/04/2020, 5:42 PM  [place], [place], [address] Phone: [phone]   Fax:  [phone]  Name: Kara Kim MRN: [id number] Date of Birth: 01-Aug-1961

## 2020-11-19 ENCOUNTER — Encounter: Payer: Self-pay | Admitting: Oncology

## 2020-11-23 ENCOUNTER — Encounter: Payer: Self-pay | Admitting: Plastic Surgery

## 2020-11-30 ENCOUNTER — Telehealth: Payer: Self-pay

## 2020-11-30 ENCOUNTER — Encounter: Payer: Self-pay | Admitting: Plastic Surgery

## 2020-11-30 NOTE — Telephone Encounter (Signed)
Patient called to say that she has had cancer and is getting a breast lift on the other side.  Patient has heard that a lift may reduce her breast by one size and she doesn't want her breast to get smaller.  Patient said if this is the case then she may need to reconsider implants.  Please call.

## 2020-12-15 DIAGNOSIS — M8580 Other specified disorders of bone density and structure, unspecified site: Secondary | ICD-10-CM | POA: Insufficient documentation

## 2020-12-15 DIAGNOSIS — C50919 Malignant neoplasm of unspecified site of unspecified female breast: Secondary | ICD-10-CM | POA: Insufficient documentation

## 2021-01-04 ENCOUNTER — Ambulatory Visit: Admitting: Podiatry

## 2021-01-04 ENCOUNTER — Encounter: Payer: Self-pay | Admitting: Podiatry

## 2021-01-04 ENCOUNTER — Ambulatory Visit (INDEPENDENT_AMBULATORY_CARE_PROVIDER_SITE_OTHER)

## 2021-01-04 ENCOUNTER — Other Ambulatory Visit: Payer: Self-pay

## 2021-01-04 DIAGNOSIS — B351 Tinea unguium: Secondary | ICD-10-CM | POA: Diagnosis not present

## 2021-01-04 DIAGNOSIS — M722 Plantar fascial fibromatosis: Secondary | ICD-10-CM

## 2021-01-04 MED ORDER — TRIAMCINOLONE ACETONIDE 10 MG/ML IJ SUSP
10.0000 mg | Freq: Once | INTRAMUSCULAR | Status: AC
  Administered 2021-01-04: 10 mg

## 2021-01-04 NOTE — Patient Instructions (Signed)
You can use fungi-nail on the toenail  For instructions on how to put on your Plantar Fascial Brace, please visit [email or web address]   Plantar Fasciitis (Heel Spur Syndrome) with Rehab The plantar fascia is a fibrous, ligament-like, soft-tissue structure that spans the bottom of the foot. Plantar fasciitis is a condition that causes pain in the foot due to inflammation of the tissue. SYMPTOMS   Pain and tenderness on the underneath side of the foot.  Pain that worsens with standing or walking. CAUSES  Plantar fasciitis is caused by irritation and injury to the plantar fascia on the underneath side of the foot. Common mechanisms of injury include:  Direct trauma to bottom of the foot.  Damage to a small nerve that runs under the foot where the main fascia attaches to the heel bone.  Stress placed on the plantar fascia due to bone spurs. RISK INCREASES WITH:   Activities that place stress on the plantar fascia (running, jumping, pivoting, or cutting).  Poor strength and flexibility.  Improperly fitted shoes.  Tight calf muscles.  Flat feet.  Failure to warm-up properly before activity.  Obesity. PREVENTION  Warm up and stretch properly before activity.  Allow for adequate recovery between workouts.  Maintain physical fitness:  Strength, flexibility, and endurance.  Cardiovascular fitness.  Maintain a health body weight.  Avoid stress on the plantar fascia.  Wear properly fitted shoes, including arch supports for individuals who have flat feet.  PROGNOSIS  If treated properly, then the symptoms of plantar fasciitis usually resolve without surgery. However, occasionally surgery is necessary.  RELATED COMPLICATIONS   Recurrent symptoms that may result in a chronic condition.  Problems of the lower back that are caused by compensating for the injury, such as limping.  Pain or weakness of the foot during push-off following surgery.  Chronic  inflammation, scarring, and partial or complete fascia tear, occurring more often from repeated injections.  TREATMENT  Treatment initially involves the use of ice and medication to help reduce pain and inflammation. The use of strengthening and stretching exercises may help reduce pain with activity, especially stretches of the Achilles tendon. These exercises may be performed at home or with a therapist. Your caregiver may recommend that you use heel cups of arch supports to help reduce stress on the plantar fascia. Occasionally, corticosteroid injections are given to reduce inflammation. If symptoms persist for greater than 6 months despite non-surgical (conservative), then surgery may be recommended.   MEDICATION   If pain medication is necessary, then nonsteroidal anti-inflammatory medications, such as aspirin and ibuprofen, or other minor pain relievers, such as acetaminophen, are often recommended.  Do not take pain medication within 7 days before surgery.  Prescription pain relievers may be given if deemed necessary by your caregiver. Use only as directed and only as much as you need.  Corticosteroid injections may be given by your caregiver. These injections should be reserved for the most serious cases, because they may only be given a certain number of times.  HEAT AND COLD  Cold treatment (icing) relieves pain and reduces inflammation. Cold treatment should be applied for 10 to 15 minutes every 2 to 3 hours for inflammation and pain and immediately after any activity that aggravates your symptoms. Use ice packs or massage the area with a piece of ice (ice massage).  Heat treatment may be used prior to performing the stretching and strengthening activities prescribed by your caregiver, physical therapist, or athletic trainer. Use a heat pack or soak the  injury in warm water.  SEEK IMMEDIATE MEDICAL CARE IF:  Treatment seems to offer no benefit, or the condition worsens.  Any  medications produce adverse side effects.  EXERCISES- RANGE OF MOTION (ROM) AND STRETCHING EXERCISES - Plantar Fasciitis (Heel Spur Syndrome) These exercises may help you when beginning to rehabilitate your injury. Your symptoms may resolve with or without further involvement from your physician, physical therapist or athletic trainer. While completing these exercises, remember:   Restoring tissue flexibility helps normal motion to return to the joints. This allows healthier, less painful movement and activity.  An effective stretch should be held for at least 30 seconds.  A stretch should never be painful. You should only feel a gentle lengthening or release in the stretched tissue.  RANGE OF MOTION - Toe Extension, Flexion  Sit with your right / left leg crossed over your opposite knee.  Grasp your toes and gently pull them back toward the top of your foot. You should feel a stretch on the bottom of your toes and/or foot.  Hold this stretch for 10 seconds.  Now, gently pull your toes toward the bottom of your foot. You should feel a stretch on the top of your toes and or foot.  Hold this stretch for 10 seconds. Repeat  times. Complete this stretch 3 times per day.   RANGE OF MOTION - Ankle Dorsiflexion, Active Assisted  Remove shoes and sit on a chair that is preferably not on a carpeted surface.  Place right / left foot under knee. Extend your opposite leg for support.  Keeping your heel down, slide your right / left foot back toward the chair until you feel a stretch at your ankle or calf. If you do not feel a stretch, slide your bottom forward to the edge of the chair, while still keeping your heel down.  Hold this stretch for 10 seconds. Repeat 3 times. Complete this stretch 2 times per day.   STRETCH  Gastroc, Standing  Place hands on wall.  Extend right / left leg, keeping the front knee somewhat bent.  Slightly point your toes inward on your back foot.  Keeping  your right / left heel on the floor and your knee straight, shift your weight toward the wall, not allowing your back to arch.  You should feel a gentle stretch in the right / left calf. Hold this position for 10 seconds. Repeat 3 times. Complete this stretch 2 times per day.  STRETCH  Soleus, Standing  Place hands on wall.  Extend right / left leg, keeping the other knee somewhat bent.  Slightly point your toes inward on your back foot.  Keep your right / left heel on the floor, bend your back knee, and slightly shift your weight over the back leg so that you feel a gentle stretch deep in your back calf.  Hold this position for 10 seconds. Repeat 3 times. Complete this stretch 2 times per day.  STRETCH  Gastrocsoleus, Standing  Note: This exercise can place a lot of stress on your foot and ankle. Please complete this exercise only if specifically instructed by your caregiver.   Place the ball of your right / left foot on a step, keeping your other foot firmly on the same step.  Hold on to the wall or a rail for balance.  Slowly lift your other foot, allowing your body weight to press your heel down over the edge of the step.  You should feel a stretch in  your right / left calf.  Hold this position for 10 seconds.  Repeat this exercise with a slight bend in your right / left knee. Repeat 3 times. Complete this stretch 2 times per day.   STRENGTHENING EXERCISES - Plantar Fasciitis (Heel Spur Syndrome)  These exercises may help you when beginning to rehabilitate your injury. They may resolve your symptoms with or without further involvement from your physician, physical therapist or athletic trainer. While completing these exercises, remember:   Muscles can gain both the endurance and the strength needed for everyday activities through controlled exercises.  Complete these exercises as instructed by your physician, physical therapist or athletic trainer. Progress the resistance  and repetitions only as guided.  STRENGTH - Towel Curls  Sit in a chair positioned on a non-carpeted surface.  Place your foot on a towel, keeping your heel on the floor.  Pull the towel toward your heel by only curling your toes. Keep your heel on the floor. Repeat 3 times. Complete this exercise 2 times per day.  STRENGTH - Ankle Inversion  Secure one end of a rubber exercise band/tubing to a fixed object (table, pole). Loop the other end around your foot just before your toes.  Place your fists between your knees. This will focus your strengthening at your ankle.  Slowly, pull your big toe up and in, making sure the band/tubing is positioned to resist the entire motion.  Hold this position for 10 seconds.  Have your muscles resist the band/tubing as it slowly pulls your foot back to the starting position. Repeat 3 times. Complete this exercises 2 times per day.  Document Released: 10/09/2005 Document Revised: 01/01/2012 Document Reviewed: 01/21/2009 [facility] Patient Information 2014 [place], [place].

## 2021-01-10 ENCOUNTER — Ambulatory Visit: Admitting: Podiatry

## 2021-01-11 NOTE — Progress Notes (Signed)
Subjective:   Patient ID: Kara Kim, female   DOB: 60 y.o.   MRN: [id number]   HPI 60 year old female presents the office with concerns of right heel pain.  She said that she went skiing around Christmas and thought that she had bruised her heel.  She states that she has been getting discomfort in the front of her heel is worse after rest.  She denies any recent injury or trauma.  No swelling or redness.  She has tried ibuprofen although she does not take any pain medication.   Has secondary concerns of nail fungus possibly.  No pain to the nail no redness or drainage or any swelling.   Review of Systems  All other systems reviewed and are negative.  Past Medical History:  Diagnosis Date  . Anemia   . Breast cancer ([place])    right  . Complication of anesthesia   . Heart murmur    then another MD said no murmur  . Mitral valve disorders(424.0) 07/29/2013  . Osteopenia   . PONV (postoperative nausea and vomiting)   . Seasonal allergies   . Thyroid disease     Past Surgical History:  Procedure Laterality Date  . ANAL FISSURE REPAIR    . BREAST LUMPECTOMY WITH RADIOACTIVE SEED AND SENTINEL LYMPH NODE BIOPSY Right 02/19/2020   Procedure: RIGHT BREAST LUMPECTOMY X 2  WITH RADIOACTIVE SEED AND SENTINEL LYMPH NODE BIOPSY;  Surgeon: [name], MD;  Location: [facility];  Service: General;  Laterality: Right;  . CESAREAN SECTION    . COLONOSCOPY    . eye lid lift    . tummy tuck       Current Outpatient Medications:  .  COVID-19 mRNA vaccine, Pfizer, 30 MCG/0.3ML injection, , Disp: , Rfl:  .  HYDROcodone-acetaminophen (NORCO/VICODIN) 5-325 MG tablet, , Disp: , Rfl:  .  mometasone (ELOCON) 0.1 % ointment, , Disp: , Rfl:  .  terbinafine (LAMISIL) 250 MG tablet, , Disp: , Rfl:  .  Zoster Vaccine Adjuvanted [facility]) injection, , Disp: , Rfl:  .  ALPRAZolam (XANAX) 0.5 MG tablet, Take 1 tablet (0.5 mg total) by mouth at bedtime as needed for anxiety. (Patient not taking: Reported  on 10/01/2020), Disp: 5 tablet, Rfl: 0 .  Cholecalciferol (VITAMIN D-3) 125 MCG (5000 UT) TABS, Take 5,000 Units by mouth daily. , Disp: , Rfl:  .  fexofenadine (ALLEGRA) 180 MG tablet, Take 180 mg by mouth daily., Disp: , Rfl:  .  fluticasone (FLONASE) 50 MCG/ACT nasal spray, Place 2 sprays into both nostrils daily., Disp: , Rfl:  .  Multiple Vitamin (MULTIVITAMIN PO), Take by mouth daily., Disp: , Rfl:  .  NON FORMULARY, Lipisomal Gluthalione  250 mg daily, Disp: , Rfl:  .  NON FORMULARY, Liver refresh  daily, Disp: , Rfl:  .  Omega-3 Fatty Acids (FISH OIL PO), Take 2,000 mg by mouth daily., Disp: , Rfl:  .  polyethylene glycol (MIRALAX / GLYCOLAX) 17 g packet, Take 17 g by mouth daily., Disp: , Rfl:  .  Probiotic Product (PROBIOTIC DAILY PO), Take 1 tablet by mouth daily., Disp: , Rfl:  .  senna (SENOKOT) 8.6 MG tablet, Take 1 tablet by mouth as needed for constipation., Disp: , Rfl:  .  tamoxifen (NOLVADEX) 20 MG tablet, Take 20 mg by mouth daily., Disp: , Rfl:  .  thyroid (ARMOUR) 60 MG tablet, Take 60 mg by mouth daily before breakfast., Disp: , Rfl:  .  Turmeric (QC TUMERIC  COMPLEX PO), Take 500 mg by mouth daily., Disp: , Rfl:   Allergies  Allergen Reactions  . Eggs Or Egg-Derived Products Swelling  . 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 Sulfamethoxazole Rash         Objective:  Physical Exam  General: AAO x3, NAD  Dermatological: Nails are mildly hypertrophic, dystrophic discolored yellow discoloration.  No pain in the nails there is no redness or drainage or signs of infection.  No open lesions.  Vascular: Dorsalis Pedis artery and Posterior Tibial artery pedal pulses are 2/4 bilateral with immedate capillary fill time. There is no pain with calf compression, swelling, warmth, erythema.   Neruologic: Grossly intact via light touch bilateral.Negative tinel sign.  Musculoskeletal: Tenderness to palpation along the plantar medial tubercle of  the calcaneus at the insertion of plantar fascia on the right foot. There is no pain along the course of the plantar fascia within the arch of the foot. Plantar fascia appears to be intact. There is no pain with lateral compression of the calcaneus or pain with vibratory sensation. There is no pain along the course or insertion of the achilles tendon. No other areas of tenderness to bilateral lower extremities. Muscular strength 5/5 in all groups tested bilateral.  Gait: Unassisted, Nonantalgic.       Assessment:   Right heel pain, likely plantar fasciitis; onychomycosis    Plan:  -Treatment options discussed including all alternatives, risks, and complications -Etiology of symptoms were discussed -X-rays were obtained and reviewed with the patient.  No evidence of acute fracture or stress fracture. -Steroid injection performed.  See procedure note below. -Plantar fascial brace -Stretching, icing daily -Discussed shoe modifications and orthotics. -NSAIDs prn -Discussed treatment options for nail fungus.  She can start over-the-counter Fungi-Nail.  Procedure: Injection Tendon/Ligament Discussed alternatives, risks, complications and verbal consent was obtained.  Location: RIGHT plantar fascia at the glabrous junction; medial approach. Skin Prep: Alcohol  Injectate: 0.5cc 0.5% marcaine plain, 0.5 cc 2% lidocaine plain and, 1 cc kenalog 10. Disposition: Patient tolerated procedure well. Injection site dressed with a band-aid.  Post-injection care was discussed and return precautions discussed.   Return in about 5 weeks (around 02/08/2021).  Trula Slade DPM

## 2021-01-14 ENCOUNTER — Encounter: Admitting: Surgical

## 2021-01-20 ENCOUNTER — Encounter: Admitting: Surgical

## 2021-01-20 NOTE — H&P (View-Only) (Signed)
Patient ID: Kara Kim, female    DOB: 1961/01/08, 60 y.o.   MRN: [id number]  Chief Complaint  Patient presents with  . Pre-op Exam      ICD-10-CM   1. Breast asymmetry  N64.89   2. Malignant neoplasm of upper-outer quadrant of right breast in female, estrogen receptor positive ([place])  C50.411    Z17.0     History of Present Illness: Kara Kim is a 59 y.o.  female  with a history of right breast cancer followed by right breast lumpectomy x 2 followed by radiation.  She presents for preoperative evaluation for upcoming procedure, left breast mastopexy/reduction for symmetry, scheduled for 02/09/21 with Dr. Marla Roe.   Hx of PONV. No history of DVT/PE.  No family history of DVT/PE.  No family or personal history of bleeding or clotting disorders.  Patient is not currently taking any blood thinners.  No history of CVA/MI.   She is currently on tamoxifen.  Summary of Previous Visit: Hx of right breast cancer and underwent right breast lumpectomies followed by radiation that ended 03/2020.   PMH Significant for: right breast cancer, PONV   Past Medical History: Allergies: Allergies  Allergen Reactions  . Eggs Or Egg-Derived Products Swelling  . 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 Sulfamethoxazole Rash    Current Medications:  Current Outpatient Medications:  .  cephALEXin (KEFLEX) 500 MG capsule, Take 1 capsule (500 mg total) by mouth 4 (four) times daily for 3 days., Disp: 12 capsule, Rfl: 0 .  Cholecalciferol (VITAMIN D-3) 125 MCG (5000 UT) TABS, Take 5,000 Units by mouth daily. , Disp: , Rfl:  .  COVID-19 mRNA vaccine, Pfizer, 30 MCG/0.3ML injection, , Disp: , Rfl:  .  fexofenadine (ALLEGRA) 180 MG tablet, Take 180 mg by mouth daily., Disp: , Rfl:  .  fluticasone (FLONASE) 50 MCG/ACT nasal spray, Place 2 sprays into both nostrils daily., Disp: , Rfl:  .  HYDROcodone-acetaminophen (NORCO) 5-325 MG tablet, Take 1 tablet by  mouth every 6 (six) hours as needed for up to 5 days for severe pain., Disp: 20 tablet, Rfl: 0 .  Omega-3 Fatty Acids (FISH OIL PO), Take 2,000 mg by mouth daily., Disp: , Rfl:  .  ondansetron (ZOFRAN) 4 MG tablet, Take 1 tablet (4 mg total) by mouth every 8 (eight) hours as needed for nausea or vomiting., Disp: 20 tablet, Rfl: 0 .  polyethylene glycol (MIRALAX / GLYCOLAX) 17 g packet, Take 17 g by mouth daily., Disp: , Rfl:  .  Probiotic Product (PROBIOTIC DAILY PO), Take 1 tablet by mouth daily., Disp: , Rfl:  .  senna (SENOKOT) 8.6 MG tablet, Take 1 tablet by mouth as needed for constipation., Disp: , Rfl:  .  tamoxifen (NOLVADEX) 20 MG tablet, Take 20 mg by mouth daily., Disp: , Rfl:  .  thyroid (ARMOUR) 60 MG tablet, Take 60 mg by mouth daily before breakfast., Disp: , Rfl:  .  Zoster Vaccine Adjuvanted [facility]) injection, , Disp: , Rfl:  .  ALPRAZolam (XANAX) 0.5 MG tablet, Take 1 tablet (0.5 mg total) by mouth at bedtime as needed for anxiety. (Patient not taking: No sig reported), Disp: 5 tablet, Rfl: 0 .  HYDROcodone-acetaminophen (NORCO/VICODIN) 5-325 MG tablet, , Disp: , Rfl:  .  mometasone (ELOCON) 0.1 % ointment, , Disp: , Rfl:  .  Multiple Vitamin (MULTIVITAMIN PO), Take by mouth daily., Disp: , Rfl:  .  NON FORMULARY,  Lipisomal Gluthalione  250 mg daily, Disp: , Rfl:  .  NON FORMULARY, Liver refresh  daily, Disp: , Rfl:  .  terbinafine (LAMISIL) 250 MG tablet, , Disp: , Rfl:  .  Turmeric (QC TUMERIC COMPLEX PO), Take 500 mg by mouth daily., Disp: , Rfl:   Past Medical Problems: Past Medical History:  Diagnosis Date  . Anemia   . Breast cancer ([place])    right  . Complication of anesthesia   . Heart murmur    then another MD said no murmur  . Mitral valve disorders(424.0) 07/29/2013  . Osteopenia   . PONV (postoperative nausea and vomiting)   . Seasonal allergies   . Thyroid disease     Past Surgical History: Past Surgical History:  Procedure Laterality Date  . ANAL  FISSURE REPAIR    . BREAST LUMPECTOMY WITH RADIOACTIVE SEED AND SENTINEL LYMPH NODE BIOPSY Right 02/19/2020   Procedure: RIGHT BREAST LUMPECTOMY X 2  WITH RADIOACTIVE SEED AND SENTINEL LYMPH NODE BIOPSY;  Surgeon: [name], MD;  Location: [facility];  Service: General;  Laterality: Right;  . CESAREAN SECTION    . COLONOSCOPY    . eye lid lift    . tummy tuck      Social History: Social History   Socioeconomic History  . Marital status: Married    Spouse name: Not on file  . Number of children: Not on file  . Years of education: Not on file  . Highest education level: Not on file  Occupational History  . Not on file  Tobacco Use  . Smoking status: Never Smoker  . Smokeless tobacco: Never Used  Vaping Use  . Vaping Use: Never used  Substance and Sexual Activity  . Alcohol use: Yes    Alcohol/week: 4.0 standard drinks    Types: 4 Glasses of wine per week    Comment: occasional  . Drug use: No  . Sexual activity: Not on file  Other Topics Concern  . Not on file  Social History Narrative  . Not on file   Social Determinants of Health   Financial Resource Strain: Not on file  Food Insecurity: Not on file  Transportation Needs: Not on file  Physical Activity: Not on file  Stress: Not on file  Social Connections: Not on file  Intimate Partner Violence: Not on file    Family History: Family History  Problem Relation Age of Onset  . Lung cancer Mother   . Hypertension Father   . Hypertension Sister   . Seizures Son   . Colon cancer Neg Hx   . Esophageal cancer Neg Hx   . Rectal cancer Neg Hx   . Stomach cancer Neg Hx     Review of Systems: Review of Systems  Constitutional: Negative.   Respiratory: Negative.   Cardiovascular: Negative.   Gastrointestinal: Negative.   Neurological: Negative.     Physical Exam: Vital Signs BP 133/83 (BP Location: Left Arm, Patient Position: Sitting, Cuff Size: Normal)   Pulse (!) 56   Ht 5\' 3"  (1.6 m)   Wt 125 lb 3.2 oz  (56.8 kg)   SpO2 99%   BMI 22.18 kg/m   Physical Exam Constitutional:      General: Not in acute distress.    Appearance: Normal appearance. Not ill-appearing.  HENT:     Head: Normocephalic and atraumatic.  Eyes:     Pupils: Pupils are equal, round Neck:     Musculoskeletal: Normal range of motion.  Cardiovascular:     Rate and Rhythm: Normal rate    Pulses: Normal pulses.  Pulmonary:     Effort: Pulmonary effort is normal. No respiratory distress.     Breath sounds: Normal breath sounds. No wheezing.  Abdominal:     General: Abdomen is flat. There is no distension.  Musculoskeletal: Normal range of motion.  Skin:    General: Skin is warm and dry.     Findings: No erythema or rash.  Neurological:     General: No focal deficit present.     Mental Status: Alert and oriented to person, place, and time. Mental status is at baseline.     Motor: No weakness.  Psychiatric:        Mood and Affect: Mood normal.        Behavior: Behavior normal.    Assessment/Plan: The patient is scheduled for left breast mastopexy for symmetry with Dr. Marla Roe.  Risks, benefits, and alternatives of procedure discussed, questions answered and consent obtained.   Smoking Status: Non-smoker; Counseling Given?  N/A Last Mammogram: Mammogram scheduled for 02/03/2021.; Results: N/A  Caprini Score: 7, high; Risk Factors include: Age, history of breast cancer and currently on tamoxifen, and length of planned surgery. Recommendation for mechanical and pharmacological prophylaxis. Encourage early ambulation.  Given the patient's overall health history, recommend mechanical prophylaxis and early ambulation.  We will hold off on pharmacological prophylaxis.  Pictures obtained: 09/21/2020  Post-op Rx sent to pharmacy: Norco, Zofran, Keflex  Patient was provided with the breast reduction/mastopexy and General Surgical Risk consent document and Pain Medication Agreement prior to their appointment.  They had  adequate time to read through the risk consent documents and Pain Medication Agreement. We also discussed them in person together during this preop appointment. All of their questions were answered to their satisfaction.  Recommended calling if they have any further questions.  Risk consent form and Pain Medication Agreement to be scanned into patient's chart.  The risk that can be encountered with breast reduction/mastopexy were discussed and include the following but not limited to these:  Breast asymmetry, fluid accumulation, firmness of the breast, inability to breast feed, loss of nipple or areola, skin loss, decrease or no nipple sensation, fat necrosis of the breast tissue, bleeding, infection, healing delay.  There are risks of anesthesia, changes to skin sensation and injury to nerves or blood vessels.  The muscle can be temporarily or permanently injured.  You may have an allergic reaction to tape, suture, glue, blood products which can result in skin discoloration, swelling, pain, skin lesions, poor healing.  Any of these can lead to the need for revisonal surgery or stage procedures.  A reduction has potential to interfere with diagnostic procedures.  This procedure is best done when the breast is fully developed.  Changes in the breast will continue to occur over time.  Pregnancy can alter the outcomes of previous breast reduction surgery, weight gain and weigh loss can also effect the [place] term appearance.   Patient is aware to hold ibuprofen and any over-the-counter supplements including but not limited to turmeric, elderberry, multivitamin 1 week prior to surgery.   Electronically signed by: Carola Rhine Zaila Crew, PA-C 01/21/2021 8:51 AM

## 2021-01-20 NOTE — Progress Notes (Signed)
Patient ID: Kara Kim, female    DOB: 1961-03-16, 60 y.o.   MRN: [id number]  Chief Complaint  Patient presents with  . Pre-op Exam      ICD-10-CM   1. Breast asymmetry  N64.89   2. Malignant neoplasm of upper-outer quadrant of right breast in female, estrogen receptor positive ([place])  C50.411    Z17.0     History of Present Illness: Kara Kim is a 60 y.o.  female  with a history of right breast cancer followed by right breast lumpectomy x 2 followed by radiation.  She presents for preoperative evaluation for upcoming procedure, left breast mastopexy/reduction for symmetry, scheduled for 02/09/21 with Dr. Marla Roe.   Hx of PONV. No history of DVT/PE.  No family history of DVT/PE.  No family or personal history of bleeding or clotting disorders.  Patient is not currently taking any blood thinners.  No history of CVA/MI.   She is currently on tamoxifen.  Summary of Previous Visit: Hx of right breast cancer and underwent right breast lumpectomies followed by radiation that ended 03/2020.   PMH Significant for: right breast cancer, PONV   Past Medical History: Allergies: Allergies  Allergen Reactions  . Eggs Or Egg-Derived Products Swelling  . 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 Sulfamethoxazole Rash    Current Medications:  Current Outpatient Medications:  .  cephALEXin (KEFLEX) 500 MG capsule, Take 1 capsule (500 mg total) by mouth 4 (four) times daily for 3 days., Disp: 12 capsule, Rfl: 0 .  Cholecalciferol (VITAMIN D-3) 125 MCG (5000 UT) TABS, Take 5,000 Units by mouth daily. , Disp: , Rfl:  .  COVID-19 mRNA vaccine, Pfizer, 30 MCG/0.3ML injection, , Disp: , Rfl:  .  fexofenadine (ALLEGRA) 180 MG tablet, Take 180 mg by mouth daily., Disp: , Rfl:  .  fluticasone (FLONASE) 50 MCG/ACT nasal spray, Place 2 sprays into both nostrils daily., Disp: , Rfl:  .  HYDROcodone-acetaminophen (NORCO) 5-325 MG tablet, Take 1 tablet by  mouth every 6 (six) hours as needed for up to 5 days for severe pain., Disp: 20 tablet, Rfl: 0 .  Omega-3 Fatty Acids (FISH OIL PO), Take 2,000 mg by mouth daily., Disp: , Rfl:  .  ondansetron (ZOFRAN) 4 MG tablet, Take 1 tablet (4 mg total) by mouth every 8 (eight) hours as needed for nausea or vomiting., Disp: 20 tablet, Rfl: 0 .  polyethylene glycol (MIRALAX / GLYCOLAX) 17 g packet, Take 17 g by mouth daily., Disp: , Rfl:  .  Probiotic Product (PROBIOTIC DAILY PO), Take 1 tablet by mouth daily., Disp: , Rfl:  .  senna (SENOKOT) 8.6 MG tablet, Take 1 tablet by mouth as needed for constipation., Disp: , Rfl:  .  tamoxifen (NOLVADEX) 20 MG tablet, Take 20 mg by mouth daily., Disp: , Rfl:  .  thyroid (ARMOUR) 60 MG tablet, Take 60 mg by mouth daily before breakfast., Disp: , Rfl:  .  Zoster Vaccine Adjuvanted [facility]) injection, , Disp: , Rfl:  .  ALPRAZolam (XANAX) 0.5 MG tablet, Take 1 tablet (0.5 mg total) by mouth at bedtime as needed for anxiety. (Patient not taking: No sig reported), Disp: 5 tablet, Rfl: 0 .  HYDROcodone-acetaminophen (NORCO/VICODIN) 5-325 MG tablet, , Disp: , Rfl:  .  mometasone (ELOCON) 0.1 % ointment, , Disp: , Rfl:  .  Multiple Vitamin (MULTIVITAMIN PO), Take by mouth daily., Disp: , Rfl:  .  NON FORMULARY,  Lipisomal Gluthalione  250 mg daily, Disp: , Rfl:  .  NON FORMULARY, Liver refresh  daily, Disp: , Rfl:  .  terbinafine (LAMISIL) 250 MG tablet, , Disp: , Rfl:  .  Turmeric (QC TUMERIC COMPLEX PO), Take 500 mg by mouth daily., Disp: , Rfl:   Past Medical Problems: Past Medical History:  Diagnosis Date  . Anemia   . Breast cancer ([place])    right  . Complication of anesthesia   . Heart murmur    then another MD said no murmur  . Mitral valve disorders(424.0) 07/29/2013  . Osteopenia   . PONV (postoperative nausea and vomiting)   . Seasonal allergies   . Thyroid disease     Past Surgical History: Past Surgical History:  Procedure Laterality Date  . ANAL  FISSURE REPAIR    . BREAST LUMPECTOMY WITH RADIOACTIVE SEED AND SENTINEL LYMPH NODE BIOPSY Right 02/19/2020   Procedure: RIGHT BREAST LUMPECTOMY X 2  WITH RADIOACTIVE SEED AND SENTINEL LYMPH NODE BIOPSY;  Surgeon: [name], MD;  Location: [facility];  Service: General;  Laterality: Right;  . CESAREAN SECTION    . COLONOSCOPY    . eye lid lift    . tummy tuck      Social History: Social History   Socioeconomic History  . Marital status: Married    Spouse name: Not on file  . Number of children: Not on file  . Years of education: Not on file  . Highest education level: Not on file  Occupational History  . Not on file  Tobacco Use  . Smoking status: Never Smoker  . Smokeless tobacco: Never Used  Vaping Use  . Vaping Use: Never used  Substance and Sexual Activity  . Alcohol use: Yes    Alcohol/week: 4.0 standard drinks    Types: 4 Glasses of wine per week    Comment: occasional  . Drug use: No  . Sexual activity: Not on file  Other Topics Concern  . Not on file  Social History Narrative  . Not on file   Social Determinants of Health   Financial Resource Strain: Not on file  Food Insecurity: Not on file  Transportation Needs: Not on file  Physical Activity: Not on file  Stress: Not on file  Social Connections: Not on file  Intimate Partner Violence: Not on file    Family History: Family History  Problem Relation Age of Onset  . Lung cancer Mother   . Hypertension Father   . Hypertension Sister   . Seizures Son   . Colon cancer Neg Hx   . Esophageal cancer Neg Hx   . Rectal cancer Neg Hx   . Stomach cancer Neg Hx     Review of Systems: Review of Systems  Constitutional: Negative.   Respiratory: Negative.   Cardiovascular: Negative.   Gastrointestinal: Negative.   Neurological: Negative.     Physical Exam: Vital Signs BP 133/83 (BP Location: Left Arm, Patient Position: Sitting, Cuff Size: Normal)   Pulse (!) 56   Ht 5\' 3"  (1.6 m)   Wt 125 lb 3.2 oz  (56.8 kg)   SpO2 99%   BMI 22.18 kg/m   Physical Exam Constitutional:      General: Not in acute distress.    Appearance: Normal appearance. Not ill-appearing.  HENT:     Head: Normocephalic and atraumatic.  Eyes:     Pupils: Pupils are equal, round Neck:     Musculoskeletal: Normal range of motion.  Cardiovascular:     Rate and Rhythm: Normal rate    Pulses: Normal pulses.  Pulmonary:     Effort: Pulmonary effort is normal. No respiratory distress.     Breath sounds: Normal breath sounds. No wheezing.  Abdominal:     General: Abdomen is flat. There is no distension.  Musculoskeletal: Normal range of motion.  Skin:    General: Skin is warm and dry.     Findings: No erythema or rash.  Neurological:     General: No focal deficit present.     Mental Status: Alert and oriented to person, place, and time. Mental status is at baseline.     Motor: No weakness.  Psychiatric:        Mood and Affect: Mood normal.        Behavior: Behavior normal.    Assessment/Plan: The patient is scheduled for left breast mastopexy for symmetry with Dr. Marla Roe.  Risks, benefits, and alternatives of procedure discussed, questions answered and consent obtained.   Smoking Status: Non-smoker; Counseling Given?  N/A Last Mammogram: Mammogram scheduled for 02/03/2021.; Results: N/A  Caprini Score: 7, high; Risk Factors include: Age, history of breast cancer and currently on tamoxifen, and length of planned surgery. Recommendation for mechanical and pharmacological prophylaxis. Encourage early ambulation.  Given the patient's overall health history, recommend mechanical prophylaxis and early ambulation.  We will hold off on pharmacological prophylaxis.  Pictures obtained: 09/21/2020  Post-op Rx sent to pharmacy: Norco, Zofran, Keflex  Patient was provided with the breast reduction/mastopexy and General Surgical Risk consent document and Pain Medication Agreement prior to their appointment.  They had  adequate time to read through the risk consent documents and Pain Medication Agreement. We also discussed them in person together during this preop appointment. All of their questions were answered to their satisfaction.  Recommended calling if they have any further questions.  Risk consent form and Pain Medication Agreement to be scanned into patient's chart.  The risk that can be encountered with breast reduction/mastopexy were discussed and include the following but not limited to these:  Breast asymmetry, fluid accumulation, firmness of the breast, inability to breast feed, loss of nipple or areola, skin loss, decrease or no nipple sensation, fat necrosis of the breast tissue, bleeding, infection, healing delay.  There are risks of anesthesia, changes to skin sensation and injury to nerves or blood vessels.  The muscle can be temporarily or permanently injured.  You may have an allergic reaction to tape, suture, glue, blood products which can result in skin discoloration, swelling, pain, skin lesions, poor healing.  Any of these can lead to the need for revisonal surgery or stage procedures.  A reduction has potential to interfere with diagnostic procedures.  This procedure is best done when the breast is fully developed.  Changes in the breast will continue to occur over time.  Pregnancy can alter the outcomes of previous breast reduction surgery, weight gain and weigh loss can also effect the [place] term appearance.   Patient is aware to hold ibuprofen and any over-the-counter supplements including but not limited to turmeric, elderberry, multivitamin 1 week prior to surgery.   Electronically signed by: Carola Rhine Alysia Scism, PA-C 01/21/2021 8:51 AM

## 2021-01-21 ENCOUNTER — Ambulatory Visit (INDEPENDENT_AMBULATORY_CARE_PROVIDER_SITE_OTHER): Admitting: Surgical

## 2021-01-21 ENCOUNTER — Encounter: Admitting: Surgical

## 2021-01-21 ENCOUNTER — Other Ambulatory Visit: Payer: Self-pay

## 2021-01-21 ENCOUNTER — Encounter: Payer: Self-pay | Admitting: Surgical

## 2021-01-21 VITALS — BP 133/83 | HR 56 | Ht 63.0 in | Wt 125.2 lb

## 2021-01-21 DIAGNOSIS — C50411 Malignant neoplasm of upper-outer quadrant of right female breast: Secondary | ICD-10-CM

## 2021-01-21 DIAGNOSIS — N6489 Other specified disorders of breast: Secondary | ICD-10-CM

## 2021-01-21 DIAGNOSIS — Z170 Estrogen receptor positive status [ER+]: Secondary | ICD-10-CM

## 2021-01-21 MED ORDER — ONDANSETRON HCL 4 MG PO TABS: 4.0000 mg | ORAL_TABLET | Freq: Three times a day (TID) | ORAL | 0 refills | Status: DC | PRN

## 2021-01-21 MED ORDER — HYDROCODONE-ACETAMINOPHEN 5-325 MG PO TABS
1.0000 | ORAL_TABLET | Freq: Four times a day (QID) | ORAL | 0 refills | Status: AC | PRN
Start: 2021-01-21 — End: 2021-01-26

## 2021-01-21 MED ORDER — CEPHALEXIN 500 MG PO CAPS: 500.0000 mg | ORAL_CAPSULE | Freq: Four times a day (QID) | ORAL | 0 refills | Status: AC

## 2021-01-31 ENCOUNTER — Encounter (HOSPITAL_BASED_OUTPATIENT_CLINIC_OR_DEPARTMENT_OTHER): Payer: Self-pay | Admitting: Plastic Surgery

## 2021-01-31 ENCOUNTER — Other Ambulatory Visit: Payer: Self-pay

## 2021-02-03 ENCOUNTER — Ambulatory Visit
Admission: RE | Admit: 2021-02-03 | Discharge: 2021-02-03 | Disposition: A | Discharge status: Home / Self Care | Source: Ambulatory Visit | Attending: Oncology | Admitting: Oncology

## 2021-02-03 ENCOUNTER — Other Ambulatory Visit: Payer: Self-pay

## 2021-02-03 DIAGNOSIS — C50411 Malignant neoplasm of upper-outer quadrant of right female breast: Secondary | ICD-10-CM

## 2021-02-03 DIAGNOSIS — Z170 Estrogen receptor positive status [ER+]: Secondary | ICD-10-CM

## 2021-02-07 ENCOUNTER — Other Ambulatory Visit (HOSPITAL_COMMUNITY)
Admission: RE | Admit: 2021-02-07 | Discharge: 2021-02-07 | Disposition: A | Discharge status: Home / Self Care | Source: Ambulatory Visit | Attending: Plastic Surgery | Admitting: Plastic Surgery

## 2021-02-07 ENCOUNTER — Ambulatory Visit: Admitting: Podiatry

## 2021-02-07 ENCOUNTER — Other Ambulatory Visit: Payer: Self-pay

## 2021-02-07 DIAGNOSIS — Z01812 Encounter for preprocedural laboratory examination: Secondary | ICD-10-CM | POA: Diagnosis not present

## 2021-02-07 DIAGNOSIS — M722 Plantar fascial fibromatosis: Secondary | ICD-10-CM | POA: Diagnosis not present

## 2021-02-07 DIAGNOSIS — Z20822 Contact with and (suspected) exposure to covid-19: Secondary | ICD-10-CM | POA: Insufficient documentation

## 2021-02-07 LAB — SARS CORONAVIRUS 2 (TAT 6-24 HRS): SARS Coronavirus 2: NEGATIVE

## 2021-02-07 NOTE — Patient Instructions (Signed)

## 2021-02-08 DIAGNOSIS — M722 Plantar fascial fibromatosis: Secondary | ICD-10-CM | POA: Insufficient documentation

## 2021-02-08 NOTE — Progress Notes (Signed)
Subjective: 60 year old female presents the office today for follow-up evaluation of right heel pain, plantar fasciitis.  She says that overall she is doing better.  She states that she is getting some occasional discomfort but more intermittently.  She is only had discomfort 1 time at night.  She is no longer having any pain in the morning when she gets up.  She still in the stretching, icing.  No recent injury or falls or changes otherwise. Denies any systemic complaints such as fevers, chills, nausea, vomiting. No acute changes since last appointment, and no other complaints at this time.   Objective: AAO x3, NAD DP/PT pulses palpable bilaterally, CRT less than 3 seconds There is minimal discomfort on plantar medial tubercle of the calcaneus at the insertion of plantar fascial.  The plantar fashion appears to be intact.  There is no pain with lateral compression of the calcaneus.  No edema, erythema.  Negative Tinel sign.  No pain to the Achilles tendon. No pain with calf compression, swelling, warmth, erythema  Assessment: Improving right foot plantar fasciitis  Plan: -All treatment options discussed with the patient including all alternatives, risks, complications.  -She is having upcoming surgery on Wednesday so we will not hold off on any anti-inflammatories and we hold off on any steroids.  Continue stretching, icing daily.  Discussed shoe modifications and good arch support.  Continue with plantar fascial brace as well.  Overall she is doing much better and if needed once she recovers from her upcoming surgery beginning do a steroid injection if needed. -Patient encouraged to call the office with any questions, concerns, change in symptoms.   Trula Slade DPM

## 2021-02-09 ENCOUNTER — Encounter (HOSPITAL_BASED_OUTPATIENT_CLINIC_OR_DEPARTMENT_OTHER)
Admission: RE | Disposition: A | Discharge status: Home / Self Care | Payer: Self-pay | Source: Home / Self Care | Attending: Plastic Surgery

## 2021-02-09 ENCOUNTER — Ambulatory Visit (HOSPITAL_BASED_OUTPATIENT_CLINIC_OR_DEPARTMENT_OTHER): Admitting: Anesthesiology

## 2021-02-09 ENCOUNTER — Ambulatory Visit (HOSPITAL_BASED_OUTPATIENT_CLINIC_OR_DEPARTMENT_OTHER)
Admission: RE | Admit: 2021-02-09 | Discharge: 2021-02-09 | Disposition: A | Discharge status: Home / Self Care | Attending: Plastic Surgery | Admitting: Plastic Surgery

## 2021-02-09 ENCOUNTER — Other Ambulatory Visit: Payer: Self-pay

## 2021-02-09 ENCOUNTER — Encounter (HOSPITAL_BASED_OUTPATIENT_CLINIC_OR_DEPARTMENT_OTHER): Payer: Self-pay | Admitting: Plastic Surgery

## 2021-02-09 DIAGNOSIS — N6489 Other specified disorders of breast: Secondary | ICD-10-CM | POA: Insufficient documentation

## 2021-02-09 DIAGNOSIS — Z91012 Allergy to eggs: Secondary | ICD-10-CM | POA: Diagnosis not present

## 2021-02-09 DIAGNOSIS — Z923 Personal history of irradiation: Secondary | ICD-10-CM | POA: Insufficient documentation

## 2021-02-09 DIAGNOSIS — Z79891 Long term (current) use of opiate analgesic: Secondary | ICD-10-CM | POA: Insufficient documentation

## 2021-02-09 DIAGNOSIS — Z170 Estrogen receptor positive status [ER+]: Secondary | ICD-10-CM | POA: Diagnosis not present

## 2021-02-09 DIAGNOSIS — Z853 Personal history of malignant neoplasm of breast: Secondary | ICD-10-CM | POA: Diagnosis not present

## 2021-02-09 DIAGNOSIS — Z882 Allergy status to sulfonamides status: Secondary | ICD-10-CM | POA: Insufficient documentation

## 2021-02-09 DIAGNOSIS — Z79890 Hormone replacement therapy (postmenopausal): Secondary | ICD-10-CM | POA: Insufficient documentation

## 2021-02-09 DIAGNOSIS — C50411 Malignant neoplasm of upper-outer quadrant of right female breast: Secondary | ICD-10-CM | POA: Insufficient documentation

## 2021-02-09 DIAGNOSIS — Z79810 Long term (current) use of selective estrogen receptor modulators (SERMs): Secondary | ICD-10-CM | POA: Insufficient documentation

## 2021-02-09 DIAGNOSIS — Z79899 Other long term (current) drug therapy: Secondary | ICD-10-CM | POA: Insufficient documentation

## 2021-02-09 HISTORY — PX: MASTOPEXY: SHX5358

## 2021-02-09 HISTORY — DX: Family history of other specified conditions: Z84.89

## 2021-02-09 SURGERY — MASTOPEXY
Anesthesia: General | Site: Breast | Laterality: Left

## 2021-02-09 MED ORDER — LIDOCAINE-EPINEPHRINE 1 %-1:100000 IJ SOLN
INTRAMUSCULAR | Status: DC | PRN
  Administered 2021-02-09: 12 mL

## 2021-02-09 MED ORDER — CIPROFLOXACIN-FLUOCINOLONE PF 0.3-0.025 % OT SOLN
OTIC | Status: AC
  Filled 2021-02-09: qty 0.25

## 2021-02-09 MED ORDER — CEFAZOLIN SODIUM-DEXTROSE 2-4 GM/100ML-% IV SOLN
2.0000 g | INTRAVENOUS | Status: AC
  Administered 2021-02-09: 2 g via INTRAVENOUS

## 2021-02-09 MED ORDER — ACETAMINOPHEN 325 MG PO TABS: 650.0000 mg | ORAL_TABLET | ORAL | Status: DC | PRN

## 2021-02-09 MED ORDER — ONDANSETRON HCL 4 MG/2ML IJ SOLN
INTRAMUSCULAR | Status: DC | PRN
  Administered 2021-02-09: 4 mg via INTRAVENOUS

## 2021-02-09 MED ORDER — LIDOCAINE HCL (PF) 1 % IJ SOLN
INTRAMUSCULAR | Status: AC
  Filled 2021-02-09: qty 30

## 2021-02-09 MED ORDER — LACTATED RINGERS IV SOLN: INTRAVENOUS | Status: DC

## 2021-02-09 MED ORDER — DROPERIDOL 2.5 MG/ML IJ SOLN
INTRAMUSCULAR | Status: DC | PRN
  Administered 2021-02-09: .625 mg via INTRAVENOUS

## 2021-02-09 MED ORDER — FENTANYL CITRATE (PF) 100 MCG/2ML IJ SOLN
INTRAMUSCULAR | Status: DC | PRN
  Administered 2021-02-09 (×2): 50 ug via INTRAVENOUS

## 2021-02-09 MED ORDER — CEFAZOLIN SODIUM-DEXTROSE 2-4 GM/100ML-% IV SOLN
INTRAVENOUS | Status: AC
  Filled 2021-02-09: qty 100

## 2021-02-09 MED ORDER — LIDOCAINE HCL (CARDIAC) PF 100 MG/5ML IV SOSY
PREFILLED_SYRINGE | INTRAVENOUS | Status: DC | PRN
  Administered 2021-02-09: 60 mg via INTRAVENOUS

## 2021-02-09 MED ORDER — MIDAZOLAM HCL 5 MG/5ML IJ SOLN
INTRAMUSCULAR | Status: DC | PRN
  Administered 2021-02-09: 2 mg via INTRAVENOUS

## 2021-02-09 MED ORDER — OXYCODONE HCL 5 MG PO TABS: 5.0000 mg | ORAL_TABLET | Freq: Once | ORAL | Status: DC | PRN

## 2021-02-09 MED ORDER — ONDANSETRON HCL 4 MG/2ML IJ SOLN
INTRAMUSCULAR | Status: AC
  Filled 2021-02-09: qty 2

## 2021-02-09 MED ORDER — ACETAMINOPHEN 325 MG RE SUPP: 650.0000 mg | RECTAL | Status: DC | PRN

## 2021-02-09 MED ORDER — BUPIVACAINE-EPINEPHRINE (PF) 0.25% -1:200000 IJ SOLN
INTRAMUSCULAR | Status: AC
  Filled 2021-02-09: qty 30

## 2021-02-09 MED ORDER — SODIUM CHLORIDE 0.9 % IV SOLN: 250.0000 mL | INTRAVENOUS | Status: DC | PRN

## 2021-02-09 MED ORDER — CIPROFLOXACIN-DEXAMETHASONE 0.3-0.1 % OT SUSP
OTIC | Status: AC
  Filled 2021-02-09: qty 7.5

## 2021-02-09 MED ORDER — EPINEPHRINE PF 1 MG/ML IJ SOLN
INTRAMUSCULAR | Status: AC
  Filled 2021-02-09: qty 1

## 2021-02-09 MED ORDER — LIDOCAINE 2% (20 MG/ML) 5 ML SYRINGE
INTRAMUSCULAR | Status: AC
  Filled 2021-02-09: qty 5

## 2021-02-09 MED ORDER — MIDAZOLAM HCL 2 MG/2ML IJ SOLN
INTRAMUSCULAR | Status: AC
  Filled 2021-02-09: qty 2

## 2021-02-09 MED ORDER — HYDROMORPHONE HCL 1 MG/ML IJ SOLN: 0.2500 mg | INTRAMUSCULAR | Status: DC | PRN

## 2021-02-09 MED ORDER — OXYMETAZOLINE HCL 0.05 % NA SOLN
NASAL | Status: AC
  Filled 2021-02-09: qty 30

## 2021-02-09 MED ORDER — FENTANYL CITRATE (PF) 100 MCG/2ML IJ SOLN
25.0000 ug | INTRAMUSCULAR | Status: DC | PRN
Start: 2021-02-09 — End: 2021-02-09

## 2021-02-09 MED ORDER — SODIUM CHLORIDE 0.9% FLUSH: 3.0000 mL | INTRAVENOUS | Status: DC | PRN

## 2021-02-09 MED ORDER — LIDOCAINE-EPINEPHRINE 1 %-1:100000 IJ SOLN
INTRAMUSCULAR | Status: AC
  Filled 2021-02-09: qty 1

## 2021-02-09 MED ORDER — FENTANYL CITRATE (PF) 100 MCG/2ML IJ SOLN
INTRAMUSCULAR | Status: AC
  Filled 2021-02-09: qty 2

## 2021-02-09 MED ORDER — PROMETHAZINE HCL 25 MG/ML IJ SOLN
6.2500 mg | INTRAMUSCULAR | Status: DC | PRN
Start: 2021-02-09 — End: 2021-02-09

## 2021-02-09 MED ORDER — CHLORHEXIDINE GLUCONATE CLOTH 2 % EX PADS: 6.0000 | MEDICATED_PAD | Freq: Once | CUTANEOUS | Status: DC

## 2021-02-09 MED ORDER — MEPERIDINE HCL 25 MG/ML IJ SOLN
6.2500 mg | INTRAMUSCULAR | Status: DC | PRN
Start: 2021-02-09 — End: 2021-02-09

## 2021-02-09 MED ORDER — EPHEDRINE SULFATE 50 MG/ML IJ SOLN
INTRAMUSCULAR | Status: DC | PRN
  Administered 2021-02-09: 20 mg via INTRAVENOUS

## 2021-02-09 MED ORDER — DEXAMETHASONE SODIUM PHOSPHATE 4 MG/ML IJ SOLN
INTRAMUSCULAR | Status: DC | PRN
  Administered 2021-02-09: 8 mg via INTRAVENOUS

## 2021-02-09 MED ORDER — BUPIVACAINE HCL (PF) 0.25 % IJ SOLN
INTRAMUSCULAR | Status: AC
  Filled 2021-02-09: qty 30

## 2021-02-09 MED ORDER — SODIUM CHLORIDE 0.9% FLUSH: 3.0000 mL | Freq: Two times a day (BID) | INTRAVENOUS | Status: DC

## 2021-02-09 MED ORDER — DEXAMETHASONE SODIUM PHOSPHATE 10 MG/ML IJ SOLN
INTRAMUSCULAR | Status: AC
  Filled 2021-02-09: qty 1

## 2021-02-09 MED ORDER — PROPOFOL 10 MG/ML IV BOLUS
INTRAVENOUS | Status: DC | PRN
  Administered 2021-02-09: 200 mg via INTRAVENOUS

## 2021-02-09 MED ORDER — OXYCODONE HCL 5 MG PO TABS: 5.0000 mg | ORAL_TABLET | ORAL | Status: DC | PRN

## 2021-02-09 MED ORDER — OXYCODONE HCL 5 MG/5ML PO SOLN: 5.0000 mg | Freq: Once | ORAL | Status: DC | PRN

## 2021-02-09 SURGICAL SUPPLY — 78 items
ADH SKN CLS APL DERMABOND .7 (GAUZE/BANDAGES/DRESSINGS) ×2
BAG DECANTER FOR FLEXI CONT (MISCELLANEOUS) ×1 IMPLANT
BINDER BREAST LRG (GAUZE/BANDAGES/DRESSINGS) ×2 IMPLANT
BINDER BREAST MEDIUM (GAUZE/BANDAGES/DRESSINGS) IMPLANT
BINDER BREAST XLRG (GAUZE/BANDAGES/DRESSINGS) IMPLANT
BINDER BREAST XXLRG (GAUZE/BANDAGES/DRESSINGS) IMPLANT
BIOPATCH RED 1 DISK 7.0 (GAUZE/BANDAGES/DRESSINGS) IMPLANT
BLADE HEX COATED 2.75 (ELECTRODE) ×3 IMPLANT
BLADE KNIFE PERSONA 10 (BLADE) ×6 IMPLANT
BLADE SURG 10 STRL SS (BLADE) ×3 IMPLANT
BLADE SURG 15 STRL LF DISP TIS (BLADE) ×2 IMPLANT
BLADE SURG 15 STRL SS (BLADE) ×3
BNDG GAUZE ELAST 4 BULKY (GAUZE/BANDAGES/DRESSINGS) IMPLANT
CANISTER SUCT 1200ML W/VALVE (MISCELLANEOUS) ×3 IMPLANT
COVER BACK TABLE 60X90IN (DRAPES) ×3 IMPLANT
COVER MAYO STAND STRL (DRAPES) ×3 IMPLANT
COVER WAND RF STERILE (DRAPES) IMPLANT
DECANTER SPIKE VIAL GLASS SM (MISCELLANEOUS) IMPLANT
DERMABOND ADVANCED (GAUZE/BANDAGES/DRESSINGS) ×1
DERMABOND ADVANCED .7 DNX12 (GAUZE/BANDAGES/DRESSINGS) ×3 IMPLANT
DRAIN CHANNEL 19F RND (DRAIN) IMPLANT
DRAPE LAPAROSCOPIC ABDOMINAL (DRAPES) ×3 IMPLANT
DRSG OPSITE POSTOP 4X6 (GAUZE/BANDAGES/DRESSINGS) ×3 IMPLANT
DRSG PAD ABDOMINAL 8X10 ST (GAUZE/BANDAGES/DRESSINGS) ×6 IMPLANT
DRSG TEGADERM 2-3/8X2-3/4 SM (GAUZE/BANDAGES/DRESSINGS) IMPLANT
ELECT BLADE 4.0 EZ CLEAN MEGAD (MISCELLANEOUS)
ELECT REM PT RETURN 9FT ADLT (ELECTROSURGICAL) ×3
ELECTRODE BLDE 4.0 EZ CLN MEGD (MISCELLANEOUS) IMPLANT
ELECTRODE REM PT RTRN 9FT ADLT (ELECTROSURGICAL) ×2 IMPLANT
EVACUATOR SILICONE 100CC (DRAIN) IMPLANT
GAUZE SPONGE 4X4 12PLY STRL (GAUZE/BANDAGES/DRESSINGS) IMPLANT
GAUZE SPONGE 4X4 12PLY STRL LF (GAUZE/BANDAGES/DRESSINGS) IMPLANT
GLOVE SURG ENC MOIS LTX SZ6.5 (GLOVE) ×9 IMPLANT
GLOVE SURG ENC TEXT LTX SZ7.5 (GLOVE) ×5 IMPLANT
GOWN STRL REUS W/ TWL LRG LVL3 (GOWN DISPOSABLE) ×6 IMPLANT
GOWN STRL REUS W/TWL LRG LVL3 (GOWN DISPOSABLE) ×9
NDL FILTER BLUNT 18X1 1/2 (NEEDLE) ×1 IMPLANT
NDL HYPO 25X1 1.5 SAFETY (NEEDLE) ×1 IMPLANT
NDL SAFETY ECLIPSE 18X1.5 (NEEDLE) IMPLANT
NDL SPNL 18GX3.5 QUINCKE PK (NEEDLE) IMPLANT
NEEDLE FILTER BLUNT 18X 1/2SAF (NEEDLE)
NEEDLE FILTER BLUNT 18X1 1/2 (NEEDLE) IMPLANT
NEEDLE HYPO 18GX1.5 SHARP (NEEDLE)
NEEDLE HYPO 25X1 1.5 SAFETY (NEEDLE) ×3 IMPLANT
NEEDLE SPNL 18GX3.5 QUINCKE PK (NEEDLE) IMPLANT
NS IRRIG 1000ML POUR BTL (IV SOLUTION) ×3 IMPLANT
PACK BASIN DAY SURGERY FS (CUSTOM PROCEDURE TRAY) ×3 IMPLANT
PAD ALCOHOL SWAB (MISCELLANEOUS) IMPLANT
PAD FOAM SILICONE BACKED (GAUZE/BANDAGES/DRESSINGS) IMPLANT
PENCIL SMOKE EVACUATOR (MISCELLANEOUS) ×3 IMPLANT
PIN SAFETY STERILE (MISCELLANEOUS) IMPLANT
SLEEVE SCD COMPRESS KNEE MED (STOCKING) ×3 IMPLANT
SPONGE LAP 18X18 RF (DISPOSABLE) ×6 IMPLANT
STRIP CLOSURE SKIN 1/2X4 (GAUZE/BANDAGES/DRESSINGS) ×3 IMPLANT
STRIP SUTURE WOUND CLOSURE 1/2 (MISCELLANEOUS) ×2 IMPLANT
SUT MNCRL AB 4-0 PS2 18 (SUTURE) ×8 IMPLANT
SUT MON AB 3-0 SH 27 (SUTURE) ×3
SUT MON AB 3-0 SH27 (SUTURE) ×5 IMPLANT
SUT MON AB 5-0 PS2 18 (SUTURE) ×4 IMPLANT
SUT PDS 3-0 CT2 (SUTURE)
SUT PDS AB 2-0 CT2 27 (SUTURE) ×2 IMPLANT
SUT PDS II 3-0 CT2 27 ABS (SUTURE) IMPLANT
SUT SILK 3 0 PS 1 (SUTURE) IMPLANT
SUT VIC AB 3-0 SH 27 (SUTURE)
SUT VIC AB 3-0 SH 27X BRD (SUTURE) IMPLANT
SUT VICRYL 4-0 PS2 18IN ABS (SUTURE) IMPLANT
SYR 3ML 23GX1 SAFETY (SYRINGE) IMPLANT
SYR 50ML LL SCALE MARK (SYRINGE) IMPLANT
SYR BULB IRRIG 60ML STRL (SYRINGE) ×3 IMPLANT
SYR CONTROL 10ML LL (SYRINGE) ×3 IMPLANT
TAPE MEASURE VINYL STERILE (MISCELLANEOUS) ×3 IMPLANT
TOWEL GREEN STERILE FF (TOWEL DISPOSABLE) ×6 IMPLANT
TRAY DSU PREP LF (CUSTOM PROCEDURE TRAY) ×3 IMPLANT
TUBE CONNECTING 20X1/4 (TUBING) ×3 IMPLANT
TUBING INFILTRATION IT-10001 (TUBING) IMPLANT
TUBING SET GRADUATE ASPIR 12FT (MISCELLANEOUS) IMPLANT
UNDERPAD 30X36 HEAVY ABSORB (UNDERPADS AND DIAPERS) ×6 IMPLANT
YANKAUER SUCT BULB TIP NO VENT (SUCTIONS) ×3 IMPLANT

## 2021-02-09 NOTE — Interval H&P Note (Signed)
History and Physical Interval Note:  02/09/2021 7:38 AM  Kara Kim  has presented today for surgery, with the diagnosis of history of breast cancer, breast asymmetry.  The various methods of treatment have been discussed with the patient and family. After consideration of risks, benefits and other options for treatment, the patient has consented to  Procedure(s) with comments: Left breast mastopexy/reduction for symmetry (Left) - 1 hour MAMMARY REDUCTION  (BREAST) (Left) as a surgical intervention.  The patient's history has been reviewed, patient examined, no change in status, stable for surgery.  I have reviewed the patient's chart and labs.  Questions were answered to the patient's satisfaction.     Loel Lofty Kara Kim

## 2021-02-09 NOTE — Discharge Instructions (Signed)

## 2021-02-09 NOTE — Anesthesia Procedure Notes (Signed)
Procedure Name: LMA Insertion Performed by: Doreather Hoxworth, Lamont, CRNA Pre-anesthesia Checklist: Patient identified, Emergency Drugs available, Suction available and Patient being monitored Patient Re-evaluated:Patient Re-evaluated prior to induction Oxygen Delivery Method: Circle system utilized Preoxygenation: Pre-oxygenation with 100% oxygen Induction Type: IV induction Ventilation: Mask ventilation without difficulty LMA: LMA inserted LMA Size: 3.0 Number of attempts: 1 Airway Equipment and Method: Bite block Placement Confirmation: positive ETCO2 Tube secured with: Tape Dental Injury: Teeth and Oropharynx as per pre-operative assessment        

## 2021-02-09 NOTE — Op Note (Signed)
DATE OF OPERATION: 02/09/2021  LOCATION: [facility]  PREOPERATIVE DIAGNOSIS: Left breast asymmetry after breast cancer treatment  POSTOPERATIVE DIAGNOSIS: Same  PROCEDURE: Left breast mastopexy  SURGEON: Lyndee Leo Sanger Lyndzee Kliebert, DO  ASSISTANT: Roetta Sessions, PA  EBL: nil cc  CONDITION: Stable  COMPLICATIONS: None  INDICATION: The patient, Kara Kim, is a 60 y.o. female born on 1960-12-14, is here for treatment of breast asymmetry after a right breast partial mastectomy for treatment of breast cancer.   PROCEDURE DETAILS:  The patient was seen prior to surgery and marked.  The IV antibiotics were given. The patient was taken to the operating room and given a general anesthetic. A standard time out was performed and all information was confirmed by those in the room. SCDs were placed.  The breast was prepped and draped with betadine.  The breast was marked preoperatively.  The area was injected with local with epinephrine.  The #10 blade was used to make an incision around the areola.  The premarked area around the areola was incised with the #10 blade and de-epithelialized including the vertical limb.  The edge of the incision was undermined for 5 mm to improve closure.  The areola was lifted into position and secured with the 3-0 Monocryl.  The vertical limb was closed with the 3-0 Monocryl.  The 4-0 Monocryl was then used with 5-0 Monocryl to close the skin.  Derma bond and steri strips were applied. The patient was allowed to wake up and taken to recovery room in stable condition at the end of the case. The family was notified at the end of the case.   The advanced practice practitioner (APP) assisted throughout the case.  The APP was essential in retraction and counter traction when needed to make the case progress smoothly.  This retraction and assistance made it possible to see the tissue plans for the procedure.  The assistance was needed for blood control,  tissue re-approximation and assisted with closure of the incision site.

## 2021-02-09 NOTE — Transfer of Care (Signed)
Immediate Anesthesia Transfer of Care Note  Patient: Kara Kim  Procedure(s) Performed: Left breast mastopexy for symmetry (Left Breast)  Patient Location: PACU  Anesthesia Type:General  Level of Consciousness: awake and oriented  Airway & Oxygen Therapy: Patient Spontanous Breathing and Patient connected to face mask oxygen  Post-op Assessment: Report given to RN and Post -op Vital signs reviewed and stable  Post vital signs: Reviewed and stable  Last Vitals:  Vitals Value Taken Time  BP    Temp    Pulse 84 02/09/21 0921  Resp 20 02/09/21 0921  SpO2 100 % 02/09/21 0921  Vitals shown include unvalidated device data.  Last Pain:  Vitals:   02/09/21 0731  TempSrc: Oral  PainSc: 0-No pain      Patients Stated Pain Goal: 4 (41/66/06 3016)  Complications: No complications documented.

## 2021-02-09 NOTE — Anesthesia Preprocedure Evaluation (Signed)
Anesthesia Evaluation  Patient identified by MRN, date of birth, ID band Patient awake    Reviewed: Allergy & Precautions, NPO status , Patient's Chart, lab work & pertinent test results  History of Anesthesia Complications (+) PONV  Airway Mallampati: I  TM Distance: >3 FB Neck ROM: Full    Dental  (+) Dental Advisory Given, Teeth Intact   Pulmonary neg pulmonary ROS,  02/16/2020 SARS coronavirus NEG   breath sounds clear to auscultation       Cardiovascular negative cardio ROS  + Valvular Problems/Murmurs (remote h/o MVP)  Rhythm:Regular Rate:Normal     Neuro/Psych negative neurological ROS     GI/Hepatic negative GI ROS, Neg liver ROS,   Endo/Other  Hypothyroidism   Renal/GU negative Renal ROS     Musculoskeletal   Abdominal   Peds  Hematology negative hematology ROS (+)   Anesthesia Other Findings Breast cancer  Reproductive/Obstetrics                             Anesthesia Physical  Anesthesia Plan  ASA: II  Anesthesia Plan: General   Post-op Pain Management:    Induction: Intravenous  PONV Risk Score and Plan: 4 or greater and Dexamethasone, Ondansetron, Midazolam, Droperidol and Treatment may vary due to age or medical condition  Airway Management Planned: Oral ETT and LMA  Additional Equipment:   Intra-op Plan:   Post-operative Plan: Extubation in OR  Informed Consent: I have reviewed the patients History and Physical, chart, labs and discussed the procedure including the risks, benefits and alternatives for the proposed anesthesia with the patient or authorized representative who has indicated his/her understanding and acceptance.     Dental advisory given  Plan Discussed with: CRNA and Surgeon  Anesthesia Plan Comments: (PAT note written 02/13/2020 by Myra Gianotti, PA-C. )        Anesthesia Quick Evaluation

## 2021-02-09 NOTE — Anesthesia Postprocedure Evaluation (Signed)
Anesthesia Post Note  Patient: Terril Amaro Degante  Procedure(s) Performed: Left breast mastopexy for symmetry (Left Breast)     Patient location during evaluation: PACU Anesthesia Type: General Level of consciousness: awake and alert Pain management: pain level controlled Vital Signs Assessment: post-procedure vital signs reviewed and stable Respiratory status: spontaneous breathing, nonlabored ventilation and respiratory function stable Cardiovascular status: blood pressure returned to baseline and stable Postop Assessment: no apparent nausea or vomiting Anesthetic complications: no   No complications documented.  Last Vitals:  Vitals:   02/09/21 0955 02/09/21 1005  BP: 109/60 121/71  Pulse: 67 85  Resp: 18 16  Temp: (!) 36.3 C 36.6 C  SpO2: 97% 98%    Last Pain:  Vitals:   02/09/21 1005  TempSrc:   PainSc: 0-No pain                 Lynda Rainwater

## 2021-02-10 ENCOUNTER — Telehealth: Payer: Self-pay

## 2021-02-10 ENCOUNTER — Encounter (HOSPITAL_BASED_OUTPATIENT_CLINIC_OR_DEPARTMENT_OTHER): Payer: Self-pay | Admitting: Plastic Surgery

## 2021-02-10 NOTE — Telephone Encounter (Signed)
Returned patients call. Advised her she can take a shower the day after surgery as Kertesz as she doesn't have a drain. Replace soiled gauze as needed. Can keep a clean guaze on to prevent any skin irritation from the binder as well as wear a tank top underneath. Patient understood and agreed with plan.

## 2021-02-10 NOTE — Telephone Encounter (Signed)
Patient called to say that she had surgery yesterday and her instructions say that she can take a shower today.  Patient said that she is not planning to shower today, but she would like to know when she can remove the packing between the binder and her chest.  Please call.

## 2021-02-13 ENCOUNTER — Other Ambulatory Visit: Payer: Self-pay | Admitting: Oncology

## 2021-02-14 ENCOUNTER — Telehealth: Payer: Self-pay | Admitting: Oncology

## 2021-02-14 NOTE — Telephone Encounter (Signed)
Scheduled appt per 4/22 sch msg. Pt aware.  

## 2021-02-15 ENCOUNTER — Encounter: Payer: Self-pay | Admitting: Plastic Surgery

## 2021-02-15 ENCOUNTER — Other Ambulatory Visit: Payer: Self-pay

## 2021-02-15 ENCOUNTER — Ambulatory Visit (INDEPENDENT_AMBULATORY_CARE_PROVIDER_SITE_OTHER): Admitting: Plastic Surgery

## 2021-02-15 VITALS — BP 155/92 | HR 65

## 2021-02-15 DIAGNOSIS — Z719 Counseling, unspecified: Secondary | ICD-10-CM

## 2021-02-15 MED ORDER — FLUCONAZOLE 150 MG PO TABS: 150.0000 mg | ORAL_TABLET | Freq: Once | ORAL | 0 refills | Status: AC

## 2021-02-15 NOTE — Progress Notes (Signed)
The patient is a 85 old female here for follow up on her left mastopexy.  She is doing really well.  She the honeycomb dressing because it got wet.  The Steri-Strips are still in place.  There is no sign of infection.  She may have a little bit of a seroma but I do not think she has much of a breast space cavity.  So it may just be her breast.  No redness and overall she is doing well and feeling well.

## 2021-02-18 ENCOUNTER — Encounter: Admitting: Plastic Surgery

## 2021-03-01 ENCOUNTER — Encounter

## 2021-03-04 ENCOUNTER — Ambulatory Visit (INDEPENDENT_AMBULATORY_CARE_PROVIDER_SITE_OTHER): Admitting: Surgical

## 2021-03-04 ENCOUNTER — Other Ambulatory Visit: Payer: Self-pay

## 2021-03-04 DIAGNOSIS — N6489 Other specified disorders of breast: Secondary | ICD-10-CM

## 2021-03-04 NOTE — Progress Notes (Signed)
Patient is a 60 year old female here for follow-up after left breast mastopexy for symmetry with Dr. Marla Roe on 02/09/2021.  She has a history of right breast cancer followed by right breast lumpectomy x2 and radiation to the right breast.  She is overall doing really well she has some questions about restrictions and scar cream.  Chaperone present on exam On exam left NAC is viable, left breast incisions are intact.  Steri-Strips were removed.  There is no erythema noted.  No swelling noted.  No subcutaneous fluid collection noted with palpation.  Recommend continue to wear compressive garment 24/7 for a few more weeks.  Recommend avoiding any strenuous activities or heavy lifting greater than 20 pounds for a few more weeks.  She will have no restrictions after 6 weeks postop.  She can begin using scar cream in a few days if she would like.  There is no sign of infection, seroma, hematoma.  Recommend following up on an as-needed basis and calling with questions or concerns.

## 2021-03-07 NOTE — Progress Notes (Signed)
[facility]  Telephone:([phone]) [phone] Fax:(336) [phone]     ID: Summar Mcglothlin Drudge DOB: 10/21/1961  MR#: [id number]  CBJ#:[id number]  Patient Care Team: Patient, No Pcp Per (Inactive) as PCP - General (General Practice) [name], MD as Consulting Physician (General Surgery) Sameeha Rockefeller, Virgie Dad, MD as Consulting Physician (Oncology) [name], MD as Consulting Physician (Radiation Oncology) [name], MD as Consulting Physician (Gastroenterology) [name], MD as Consulting Physician (Dermatology) [name], MD (Family Medicine) [name], MD as Consulting Physician (Obstetrics and Gynecology) [name], RN as Oncology Nurse Navigator [name], RN as Oncology Nurse Navigator Dillingham, Loel Lofty, DO as Attending Physician (Plastic Surgery) Thornton Park, MD as Consulting Physician (Gastroenterology) [name], MD OTHER MD:   CHIEF COMPLAINT: estrogen receptor positive breast cancer  CURRENT TREATMENT: tamoxifen   INTERVAL HISTORY: Aaralyn returns today for follow up of her estrogen receptor positive breast cancer.   She started tamoxifen on 05/23/2020.  She is tolerating this remarkably well although she worries about possible side effects and would prefer not to take it.  Since her last visit, she underwent breast MRI on 08/23/2020 showing: breast composition D; no evidence of malignancy in either breast.  She also underwent bilateral diagnostic mammography with tomography at [facility] on 02/03/2021 showing: breast density category D; no evidence of malignancy in either breast.   She also underwent left breast mastopexy on 02/09/2021 under Dr. Marla Roe. I do not find a pathology report from that procedure.  Of note, she also underwent colonoscopy on 10/01/2020 under Dr. Tarri Glenn. Pathology showed tubular adenomas, and recall was recommended in 3 years.   REVIEW OF SYSTEMS: Wende walks about 5 miles on  average a day.  She did very well with her recent left breast lift.  She pulled off some of the glue and that caused a little bit of discomfort and she still has a little area where there is some glue and she wanted to know what to do about that.  A detailed review of systems today was otherwise entirely benign   COVID 19 VACCINATION STATUS: J&J x1 in 01/2020, with Diablock booster 07/2020   HISTORY OF CURRENT ILLNESS: From the original intake note:  Floetta Brickey Overturf had routine screening mammography showing a possible abnormality in the right breast. She underwent right diagnostic mammography with tomography at [facility] on 12/24/2019 showing: breast density category D; 0.6 cm grouped calcifications in the upper-outer right breast.  Accordingly on 12/26/2019 she proceeded to biopsy of the right breast area in question. 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 Prognostic indicators significant for: estrogen receptor, 100% positive and progesterone receptor, 100% positive, both with strong staining intensity. Proliferation marker Ki67 at 5%. HER2 negative by immunohistochemistry (1+).  The patient's subsequent history is as detailed below   PAST MEDICAL HISTORY: Past Medical History:  Diagnosis Date  . Anemia   . Breast cancer ([facility])    right  . Complication of anesthesia   . Family history of adverse reaction to anesthesia    mother with history N/V  . Heart murmur    then another MD said no murmur  . Mitral valve disorders(424.0) 07/29/2013  . Osteopenia   . PONV (postoperative nausea and vomiting)   . Seasonal allergies   . Thyroid disease   Persistently elevated CRP   PAST SURGICAL HISTORY: Past Surgical History:  Procedure Laterality Date  . ANAL FISSURE REPAIR    . BREAST LUMPECTOMY WITH RADIOACTIVE SEED AND SENTINEL LYMPH NODE BIOPSY Right 02/19/2020    Procedure: RIGHT BREAST LUMPECTOMY X 2  WITH RADIOACTIVE SEED AND SENTINEL LYMPH NODE BIOPSY;  Surgeon: [name], MD;  Location: [facility];  Service: General;  Laterality: Right;  . CESAREAN SECTION    . COLONOSCOPY    . eye lid lift    . MASTOPEXY Left 02/09/2021   Procedure: Left breast mastopexy for symmetry;  Surgeon: Wallace Going, DO;  Location: [facility];  Service: Plastics;  Laterality: Left;  . tummy tuck    Anal fissure repair   FAMILY HISTORY: Family History  Problem Relation Age of Onset  . Lung cancer Mother   . Hypertension Father   . Hypertension Sister   . Seizures Son   . Colon cancer Neg Hx   . Esophageal cancer Neg Hx   . Rectal cancer Neg Hx   . Stomach cancer Neg Hx    She reports lung cancer in her mother, who was a smoker. She died age 65. The patient's father died age 22 from a ruptured aneurysm. His mother had cancer but the patient does not know much about it except "it was not an inherited type". The patient has 2 sisters, no brothers. One sister, Herma Uballe, is an Materials engineer in [place].   GYNECOLOGIC HISTORY:  No LMP recorded. Patient is postmenopausal. Menarche: 60 years old Age at first live birth: 60 years old [place] P 2 LMP mid-50's HRT yes, about a decade, ending at the time of breast cancer diagnosis (MAR 2021) Hysterectomy? no BSO? no   SOCIAL HISTORY: (updated May 2022) Nimco worked in [employer] fund[employer], but is now retired. At home it's just she and her husband [name], who does custom woodworking.  They also have a 18 year old dog and 2 cats.  Their 2 sons are [name], 23, working for [employer] in [place], and and [name] 21, who will graduate from college a year from now.  He lives in [place]. The patient attends [employer] locally   ADVANCED DIRECTIVES: in the absence of any document to the contrary the patient's husband is her HCPOA   HEALTH MAINTENANCE: Social History   Tobacco Use  .  Smoking status: Never Smoker  . Smokeless tobacco: Never Used  Vaping Use  . Vaping Use: Never used  Substance Use Topics  . Alcohol use: Yes    Alcohol/week: 4.0 standard drinks    Types: 4 Glasses of wine per week    Comment: occasional  . Drug use: No     Colonoscopy: 09/2020 (Dr. Tarri Glenn), recall 2024  PAP: UTD/ [name]  Bone density:    Allergies  Allergen Reactions  . Eggs Or Egg-Derived Products Swelling  . 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 Sulfamethoxazole Rash    Current Outpatient Medications  Medication Sig Dispense Refill  . Cholecalciferol (VITAMIN D-3) 125 MCG (5000 UT) TABS Take 5,000 Units by mouth daily.     . fexofenadine (ALLEGRA) 180 MG tablet Take 180 mg by mouth daily.    . fluticasone (FLONASE) 50 MCG/ACT nasal spray Place 2 sprays into both nostrils daily.    . Multiple Vitamin (MULTIVITAMIN PO) Take by mouth daily.    . Omega-3 Fatty Acids (FISH OIL PO) Take 2,000 mg by mouth daily.    . ondansetron (ZOFRAN) 4 MG tablet Take  1 tablet (4 mg total) by mouth every 8 (eight) hours as needed for nausea or vomiting. 20 tablet 0  . polyethylene glycol (MIRALAX / GLYCOLAX) 17 g packet Take 17 g by mouth daily.    . Probiotic Product (PROBIOTIC DAILY PO) Take 1 tablet by mouth daily.    Marland Kitchen senna (SENOKOT) 8.6 MG tablet Take 1 tablet by mouth as needed for constipation.    . tamoxifen (NOLVADEX) 20 MG tablet Take 1 tablet (20 mg total) by mouth daily. 90 tablet 4  . thyroid (ARMOUR) 60 MG tablet Take 60 mg by mouth daily before breakfast.    . vitamin k 100 MCG tablet Take 100 mcg by mouth daily.     No current facility-administered medications for this visit.    OBJECTIVE:  White woman who appears younger than stated age 26:   03/08/21 1418  BP: 103/73  Pulse: 67  Resp: 18  Temp: 97.7 F (36.5 C)  SpO2: 97%     Body mass index is 22.73 kg/m.   Wt Readings from Last 3 Encounters:  03/08/21 128 lb 4.8 oz (58.2  kg)  02/09/21 127 lb 13.9 oz (58 kg)  01/21/21 125 lb 3.2 oz (56.8 kg)      ECOG FS:1 - Symptomatic but completely ambulatory  Sclerae unicteric, EOMs intact Wearing a mask No cervical or supraclavicular adenopathy Lungs no rales or rhonchi Heart regular rate and rhythm Abd soft, nontender, positive bowel sounds MSK no focal spinal tenderness, no upper extremity lymphedema Neuro: nonfocal, well oriented, appropriate affect Breasts: The right breast is status post lumpectomy and radiation with no evidence of local recurrence.  The left breast is status post recent mastopexy.  The incisions are healing very nicely.  The cosmetic result is very good.  Both axillae are benign.   LAB RESULTS:  CMP     Component Value Date/Time   NA 140 02/12/2020 1200   K 4.1 02/12/2020 1200   CL 102 02/12/2020 1200   CO2 29 02/12/2020 1200   GLUCOSE 83 02/12/2020 1200   BUN 21 (H) 02/12/2020 1200   CREATININE 0.91 02/12/2020 1200   CREATININE 1.04 (H) 01/07/2020 1535   CALCIUM 9.5 02/12/2020 1200   PROT 6.4 (L) 01/07/2020 1535   ALBUMIN 3.9 01/07/2020 1535   AST 23 01/07/2020 1535   ALT 16 01/07/2020 1535   ALKPHOS 62 01/07/2020 1535   BILITOT 0.6 01/07/2020 1535   GFRNONAA >60 02/12/2020 1200   GFRNONAA 59 (L) 01/07/2020 1535   GFRAA >60 02/12/2020 1200   GFRAA >60 01/07/2020 1535    No results found for: TOTALPROTELP, ALBUMINELP, A1GS, A2GS, BETS, BETA2SER, GAMS, MSPIKE, SPEI  Lab Results  Component Value Date   WBC 4.5 02/12/2020   NEUTROABS 2.4 01/07/2020   HGB 14.3 02/12/2020   HCT 43.4 02/12/2020   MCV 99.3 02/12/2020   PLT 231 02/12/2020    No results found for: LABCA2  No components found for: [id number]  No results for input(s): INR in the last 168 hours.  No results found for: LABCA2  No results found for: GBM211  No results found for: DBZ208  No results found for: YEM336  No results found for: CA2729  No components found for: HGQUANT  No results found  for: CEA1 / No results found for: CEA1   No results found for: AFPTUMOR  No results found for: CHROMOGRNA  No results found for: KPAFRELGTCHN, LAMBDASER, KAPLAMBRATIO (kappa/lambda light chains)  No results found for: HGBA, HGBA2QUANT, HGBFQUANT,  HGBSQUAN (Hemoglobinopathy evaluation)   No results found for: LDH  No results found for: IRON, TIBC, IRONPCTSAT (Iron and TIBC)  No results found for: FERRITIN  Urinalysis No results found for: COLORURINE, APPEARANCEUR, LABSPEC, PHURINE, GLUCOSEU, HGBUR, BILIRUBINUR, KETONESUR, PROTEINUR, UROBILINOGEN, NITRITE, LEUKOCYTESUR   STUDIES: No results found.   ELIGIBLE FOR AVAILABLE RESEARCH PROTOCOL: AET  ASSESSMENT: 60 y.o. [place] woman status post right breast upper outer quadrant biopsy 12/26/2019 for a clinical T1b NX, stage IA invasive ductal carcinoma, grade 2, estrogen and progesterone receptor strongly positive, HER-2 not amplified, with an MIB-1 of 5%  (a) MRI obtained for further evaluation of category D breast density showed 2 additional areas of concern in the central right breast and 2 prominent low-lying right axillary lymph nodes.  The left was unremarkable  (b) biopsy of 1 additional area in the right breast on 01/30/2020 showed invasive ductal carcinoma, E-cadherin positive, grade 2, estrogen and progesterone receptor positive, with an MIB-1 of 1% and no HER-2 amplification  (1) right lumpectomy and sentinel lymph node sampling 02/19/2020 showed a pT1b pNo, stage IA invasive ductal carcinoma, grade 2, with close but negative margins.  (a) a total of 2 axillary lymph nodes were removed  (2) Oncotype score of 5 predicts a risk of recurrence outside the breast over the next 9 years of 3% if the patient's only systemic therapy is antiestrogens for 5 years.  It also predicts no benefit from adjuvant chemotherapy.  (3) adjuvant radiation to be completed 04/19/2020  (4) started tamoxifen 05/23/2020   PLAN: Lyrika is  tolerating tamoxifen well and the plan is to continue that a total of 5 years.  She continues to have very dense breasts.  Mammograms are obtained every April but are not really that useful in her case.  She needs a yearly breast MRI until her breast density decreases to a B.  The order has been placed for this November.  I commended her wonderful exercise program.  I wrote her a prescription for additional bras.  She will return to see [place] in 1 year.  She knows to call for any other issue that may develop before then  Total encounter time 25 minutes.Sarajane Jews C. Lafayette Dunlevy, MD 03/08/2021 2:34 PM Medical Oncology and Hematology [facility] [place], [place] [address] Tel. [phone]    Fax. [phone]   This document serves as a record of services personally performed by Lurline Del, MD. It was created on his behalf by Wilburn Mylar, a trained medical scribe. The creation of this record is based on the scribe's personal observations and the provider's statements to them.   I, Lurline Del MD, have reviewed the above documentation for accuracy and completeness, and I agree with the above.   *Total Encounter Time as defined by the Centers for Medicare and Medicaid Services includes, in addition to the face-to-face time of a patient visit (documented in the note above) non-face-to-face time: obtaining and reviewing outside history, ordering and reviewing medications, tests or procedures, care coordination (communications with other health care professionals or caregivers) and documentation in the medical record.

## 2021-03-08 ENCOUNTER — Other Ambulatory Visit: Payer: Self-pay

## 2021-03-08 ENCOUNTER — Inpatient Hospital Stay: Attending: Oncology | Admitting: Oncology

## 2021-03-08 VITALS — BP 103/73 | HR 67 | Temp 97.7°F | Resp 18 | Ht 63.0 in | Wt 128.3 lb

## 2021-03-08 DIAGNOSIS — Z8249 Family history of ischemic heart disease and other diseases of the circulatory system: Secondary | ICD-10-CM | POA: Diagnosis not present

## 2021-03-08 DIAGNOSIS — Z170 Estrogen receptor positive status [ER+]: Secondary | ICD-10-CM | POA: Diagnosis not present

## 2021-03-08 DIAGNOSIS — C50411 Malignant neoplasm of upper-outer quadrant of right female breast: Secondary | ICD-10-CM | POA: Insufficient documentation

## 2021-03-08 DIAGNOSIS — Z923 Personal history of irradiation: Secondary | ICD-10-CM | POA: Diagnosis not present

## 2021-03-08 DIAGNOSIS — Z79810 Long term (current) use of selective estrogen receptor modulators (SERMs): Secondary | ICD-10-CM | POA: Insufficient documentation

## 2021-03-08 DIAGNOSIS — Z801 Family history of malignant neoplasm of trachea, bronchus and lung: Secondary | ICD-10-CM | POA: Insufficient documentation

## 2021-03-08 MED ORDER — TAMOXIFEN CITRATE 20 MG PO TABS: 20.0000 mg | ORAL_TABLET | Freq: Every day | ORAL | 4 refills | Status: DC

## 2021-03-09 ENCOUNTER — Telehealth: Payer: Self-pay | Admitting: Oncology

## 2021-03-09 NOTE — Telephone Encounter (Signed)
Scheduled appointment per 05/17 los. Patient will receive calender.  

## 2021-04-19 ENCOUNTER — Encounter (HOSPITAL_COMMUNITY): Payer: Self-pay

## 2021-05-09 ENCOUNTER — Encounter: Payer: Self-pay | Admitting: Podiatry

## 2021-05-09 ENCOUNTER — Ambulatory Visit (INDEPENDENT_AMBULATORY_CARE_PROVIDER_SITE_OTHER): Admitting: Podiatry

## 2021-05-09 ENCOUNTER — Other Ambulatory Visit: Payer: Self-pay

## 2021-05-09 DIAGNOSIS — R200 Anesthesia of skin: Secondary | ICD-10-CM

## 2021-05-09 DIAGNOSIS — D3610 Benign neoplasm of peripheral nerves and autonomic nervous system, unspecified: Secondary | ICD-10-CM

## 2021-05-16 NOTE — Progress Notes (Signed)
Subjective: 60 year old female presents the office today for concerns of numbness in her toes 2, 3, 4 on the last 2 weeks.  She states the pain in her right heel is resolved.  Does not hurt when she is in no swelling.  She has numbness.  No injury. Denies any systemic complaints such as fevers, chills, nausea, vomiting. No acute changes since last appointment, and no other complaints at this time.   Objective: AAO x3, NAD DP/PT pulses palpable bilaterally, CRT less than 3 seconds There is no specific tenderness.  There is subjective numbness into the most of the second and third digit.  There is no area of pinpoint tenderness.  No significant palpable neuroma identified today.  MMT 5/5. No open lesions or pre-ulcerative lesions.  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  -Discussed offloading for neuroma.  Supportive shoe gear as well.  Can use anti-inflammatories as needed.  Need to monitor if notices radiating or getting worse.  We will see her back in 4 weeks or sooner if needed. -Patient encouraged to call the office with any questions, concerns, change in symptoms.   Trula Slade DPM

## 2021-06-04 IMAGING — MG DIGITAL DIAGNOSTIC BILAT W/ TOMO W/ CAD
8 of 15 series · 9 of 40 positions shown · non-contrast
Comparison: Previous exam(s).

CLINICAL DATA: 60-year-old female presenting for annual exam.
History of right breast cancer in 1211 status post lumpectomy and
radiation. No new problems.

EXAM:
DIGITAL DIAGNOSTIC BILATERAL MAMMOGRAM WITH TOMOSYNTHESIS AND CAD
TECHNIQUE: Bilateral digital diagnostic mammography and breast tomosynthesis
was performed. The images were evaluated with computer-aided
detection.

[R CC]
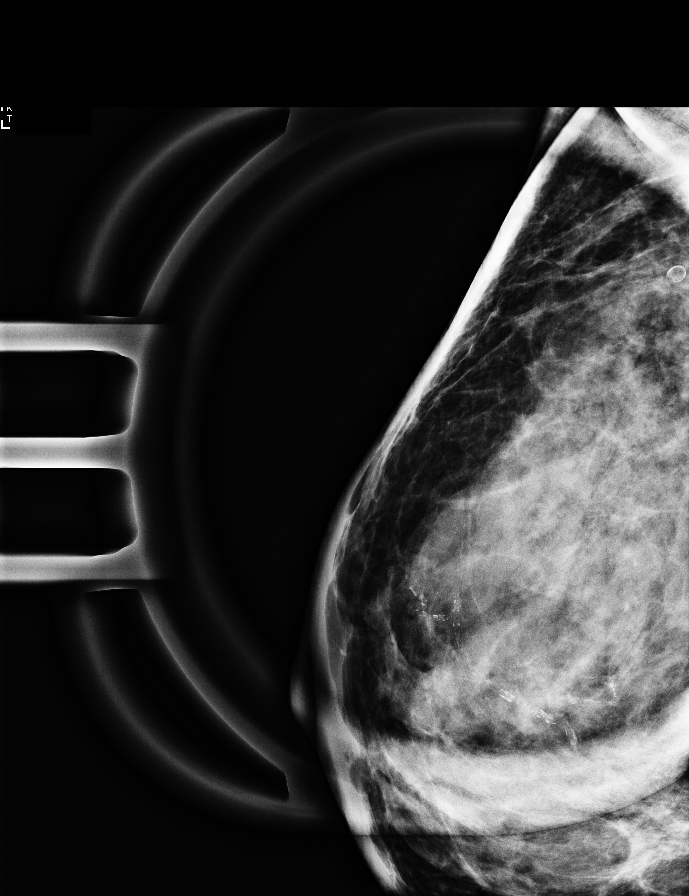

[R XCCL synth-2D]
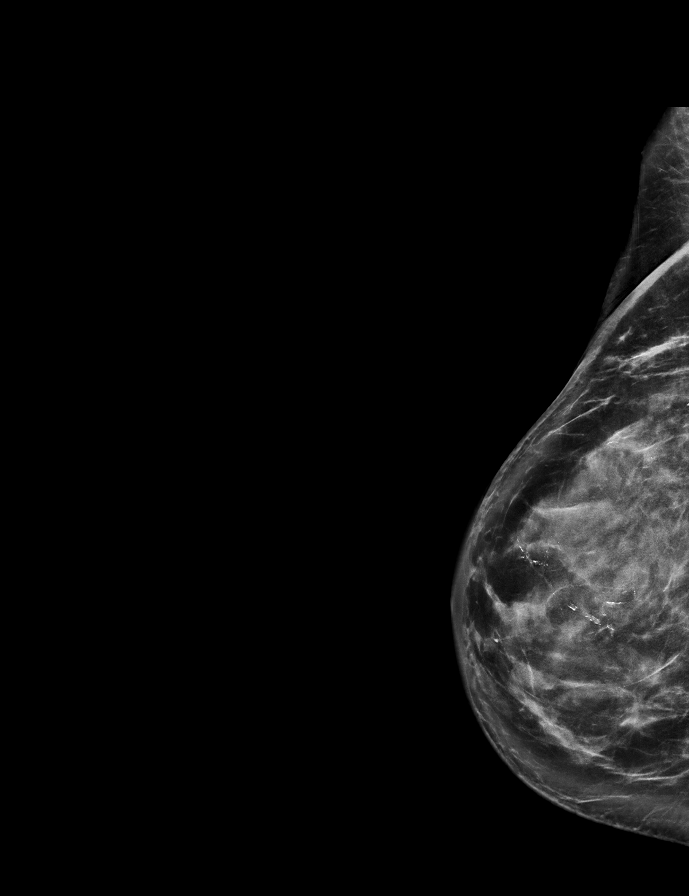

[L MLO synth-2D]
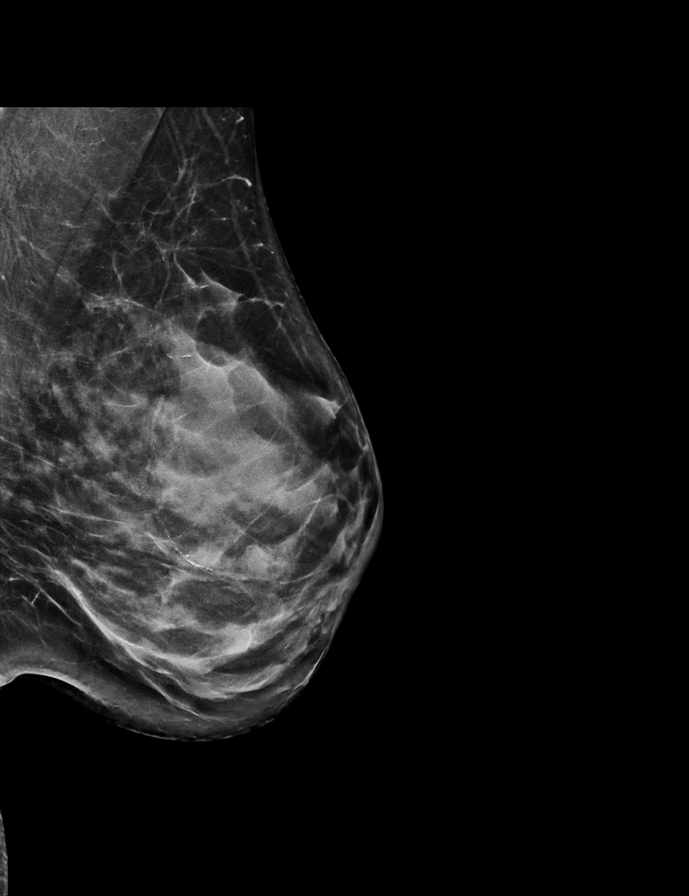

[R CC synth-2D (1 of 2)]
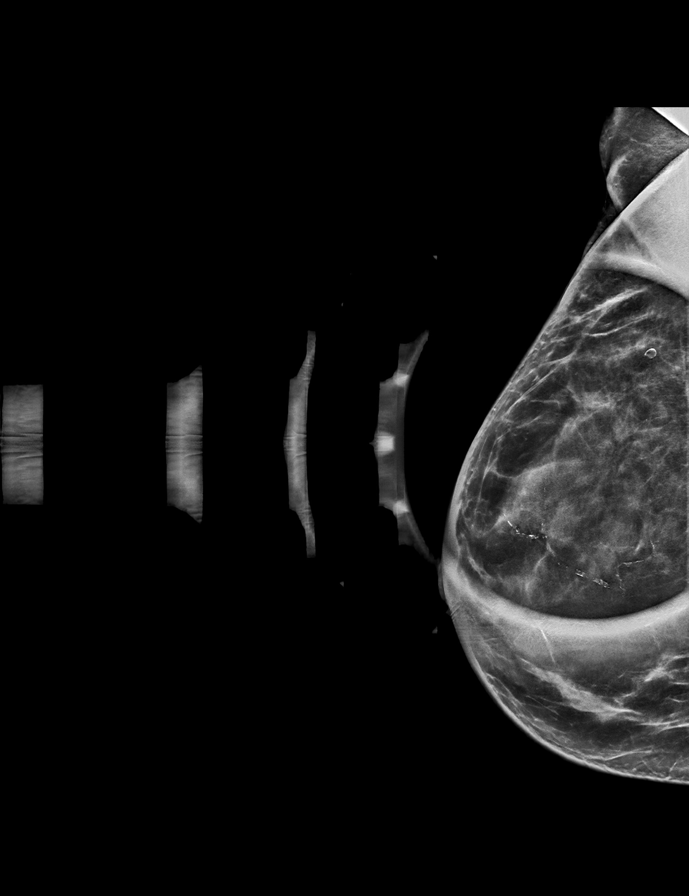

[R CC synth-2D (2 of 2)]
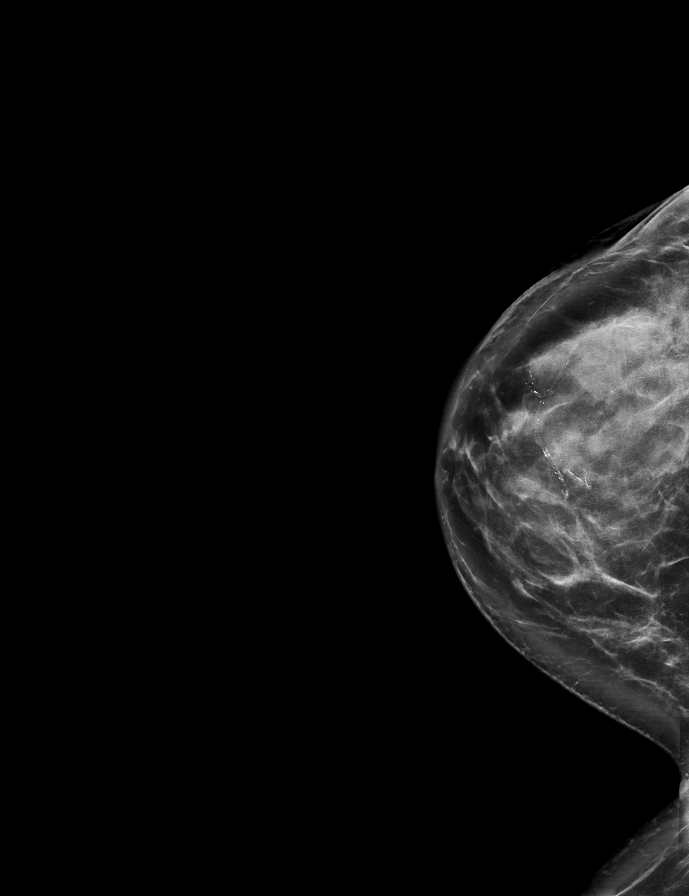

[L XCCL synth-2D]
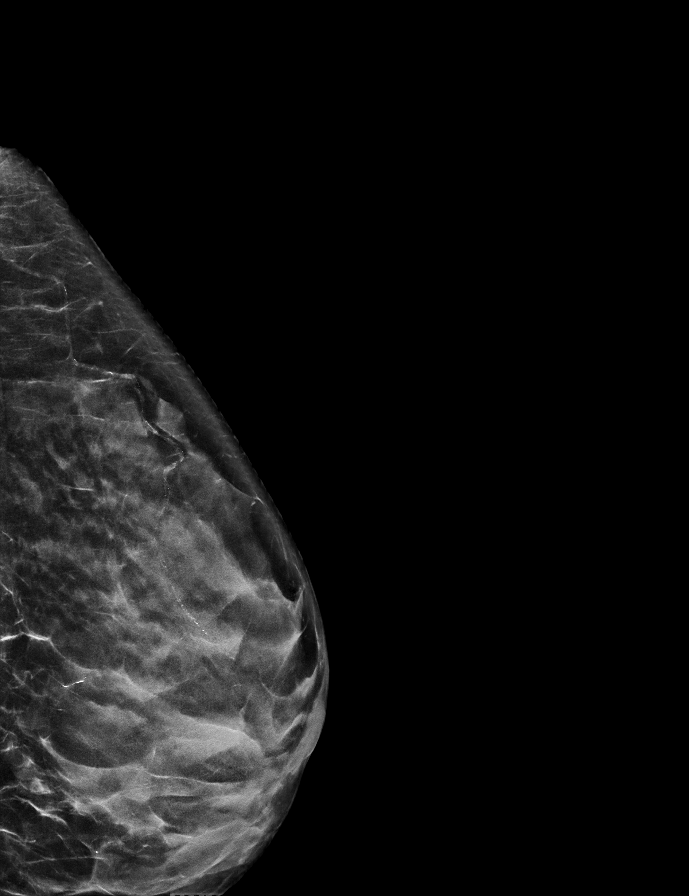

[R MLO synth-2D]
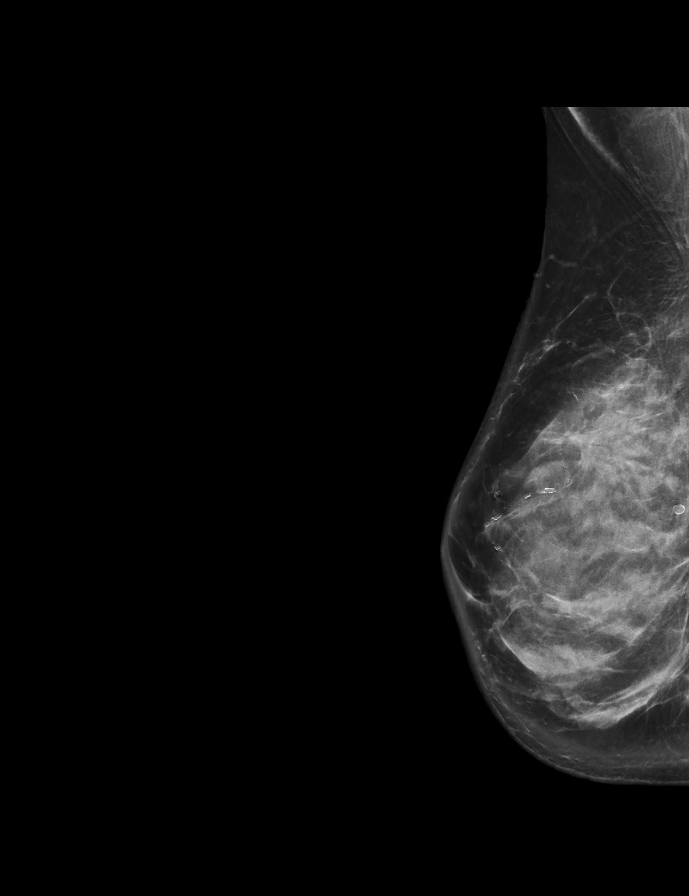

[L MLO tomo · 2 of 65 frames shown]
[frame 21/65]
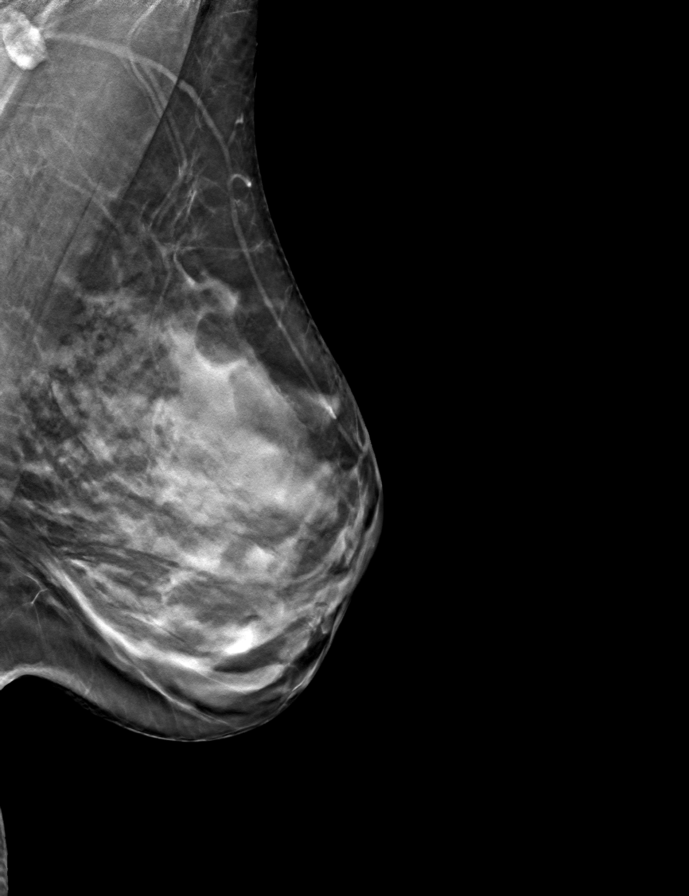
[frame 33/65]
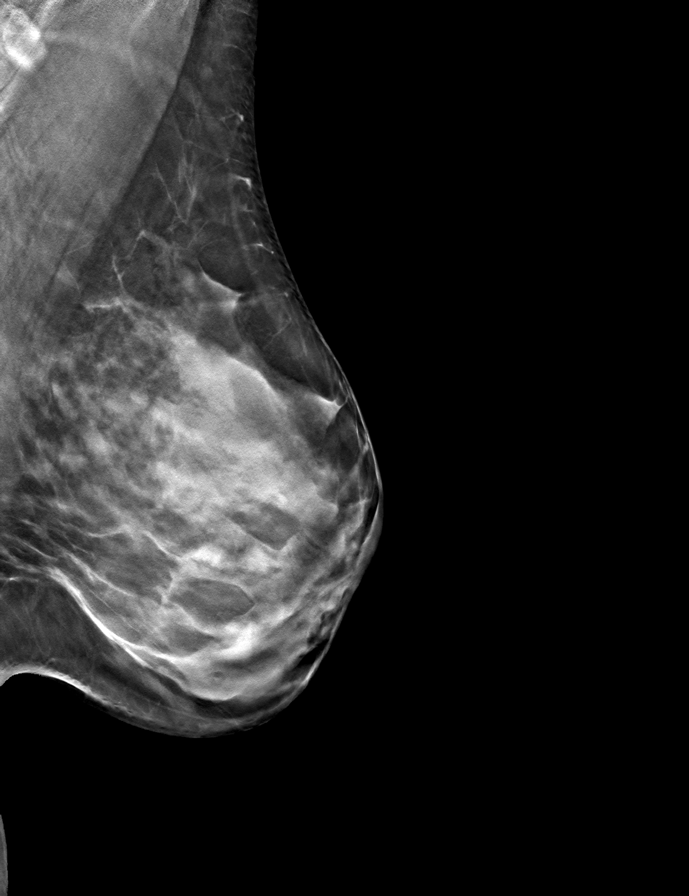

[9 of 40 positions shown; findings below may reference images not displayed]

ACR Breast Density Category d: The breast tissue is extremely dense,
which lowers the sensitivity of mammography.
FINDINGS: Right breast: A spot 2D magnification view of the lumpectomy site
was performed in addition to standard views. There are expected
postsurgical changes. No suspicious mass, distortion, or
microcalcifications are identified to suggest presence of
malignancy.

Left breast: No suspicious mass, distortion, or microcalcifications
are identified to suggest presence of malignancy.
IMPRESSION: Expected postsurgical changes in the right breast. No mammographic
evidence of malignancy bilaterally.

RECOMMENDATION:
Diagnostic bilateral mammogram in 1 year.

I have discussed the findings and recommendations with the patient.
If applicable, a reminder letter will be sent to the patient
regarding the next appointment.

BI-RADS CATEGORY  2: Benign.

## 2021-06-06 ENCOUNTER — Ambulatory Visit: Admitting: Podiatry

## 2021-07-31 ENCOUNTER — Encounter: Payer: Self-pay | Admitting: Oncology

## 2021-08-05 ENCOUNTER — Other Ambulatory Visit

## 2021-08-09 ENCOUNTER — Ambulatory Visit
Admission: RE | Admit: 2021-08-09 | Discharge: 2021-08-09 | Disposition: A | Discharge status: Home / Self Care | Source: Ambulatory Visit | Attending: Oncology | Admitting: Oncology

## 2021-08-09 ENCOUNTER — Other Ambulatory Visit: Payer: Self-pay

## 2021-08-09 DIAGNOSIS — Z170 Estrogen receptor positive status [ER+]: Secondary | ICD-10-CM

## 2021-08-09 DIAGNOSIS — C50411 Malignant neoplasm of upper-outer quadrant of right female breast: Secondary | ICD-10-CM

## 2021-08-09 MED ORDER — GADOBUTROL 1 MMOL/ML IV SOLN
6.0000 mL | Freq: Once | INTRAVENOUS | Status: AC | PRN
  Administered 2021-08-09: 6 mL via INTRAVENOUS

## 2021-08-22 NOTE — Progress Notes (Signed)
[place]  [phone] [phone]     ID: Kara Kim DOB: May 10, 1961  MR#: [id number]  EXN#:[id number]  Patient Care Team: Patient, No Pcp Per (Inactive) as PCP - General (General Practice) Kara Kussmaul, MD as Consulting Physician (General Surgery) Kara Kim, Kara Dad, MD as Consulting Physician (Oncology) Kara Rudd, MD as Consulting Physician (Radiation Oncology) Kara Silence, MD as Consulting Physician (Gastroenterology) Kara Pigg, MD as Consulting Physician (Dermatology) Kara Gula, MD (Family Medicine) Kara Pearson, MD as Consulting Physician (Obstetrics and Gynecology) Kara Kaufmann, RN as Oncology Nurse Navigator Kara Germany, RN as Oncology Nurse Navigator Dillingham, Kara Lofty, DO as Attending Physician (Plastic Surgery) [place] Park, MD as Consulting Physician (Gastroenterology) [name], MD OTHER MD:   CHIEF COMPLAINT: estrogen receptor positive breast cancer  CURRENT TREATMENT: tamoxifen   INTERVAL HISTORY: Kara Kim returns today for follow up of her estrogen receptor positive breast cancer.   She started tamoxifen on 05/23/2020.  Hot flashes and vaginal wetness are no significant issues.  She does report decreased libido.  Since her last visit, she underwent breast MRI on 08/09/2021 showing: breast composition D; no evidence of malignancy in either breast.   REVIEW OF SYSTEMS: Breena feels tired sometimes but mostly walks 5000 steps a day and goes to the gym regularly.  She has gained some weight which concerns her.  A detailed review of systems today was otherwise stable.   COVID 19 VACCINATION STATUS: J&J x1 in 01/2020, with [place] booster 07/2020   HISTORY OF CURRENT ILLNESS: From the original intake note:  Kara Kim had routine screening mammography showing a possible abnormality in the right breast. She underwent right diagnostic mammography with tomography at [facility] on 12/24/2019 showing: breast density category D; 0.6 cm grouped calcifications in the upper-outer right breast.  Accordingly on 12/26/2019 she proceeded to biopsy of the right breast area in question. 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 Prognostic indicators significant for: estrogen receptor, 100% positive and progesterone receptor, 100% positive, both with strong staining intensity. Proliferation marker Ki67 at 5%. HER2 negative by immunohistochemistry (1+).  The patient's subsequent history is as detailed below   PAST MEDICAL HISTORY: Past Medical History:  Diagnosis Date   Anemia    Breast cancer ([place])    right   Complication of anesthesia    Family history of adverse reaction to anesthesia    mother with history N/V   Heart murmur    then another MD said no murmur   Mitral valve disorders(424.0) 07/29/2013   Osteopenia    PONV (postoperative nausea and vomiting)    Seasonal allergies    Thyroid disease   Persistently elevated CRP   PAST SURGICAL HISTORY: Past Surgical History:  Procedure Laterality Date   ANAL FISSURE REPAIR     BREAST LUMPECTOMY WITH RADIOACTIVE SEED AND SENTINEL LYMPH NODE BIOPSY Right 02/19/2020   Procedure: RIGHT BREAST LUMPECTOMY X 2  WITH RADIOACTIVE SEED AND SENTINEL LYMPH NODE BIOPSY;  Surgeon: Kara Kussmaul, MD;  Location: [facility];  Service: General;  Laterality: Right;   CESAREAN SECTION     COLONOSCOPY     eye lid lift     MASTOPEXY Left 02/09/2021   Procedure: Left breast mastopexy for symmetry;  Surgeon: Wallace Going, DO;  Location: [facility];  Service: Plastics;  Laterality: Left;   tummy tuck  Anal fissure repair   FAMILY HISTORY: Family History  Problem Relation Age of Onset   Lung cancer Mother    Hypertension Father    Hypertension Sister    Seizures Son    Colon cancer Neg Hx     Esophageal cancer Neg Hx    Rectal cancer Neg Hx    Stomach cancer Neg Hx    She reports lung cancer in her mother, who was a smoker. She died age 69. The patient's father died age 60 from a ruptured aneurysm. His mother had cancer but the patient does not know much about it except "it was not an inherited type". The patient has 2 sisters, no brothers. One sister, Raynesha Tiedt, is an Materials engineer in [place].   GYNECOLOGIC HISTORY:  No LMP recorded. Patient is postmenopausal. Menarche: 60 years old Age at first live birth: 60 years old [place] P 2 LMP mid-50's HRT yes, about a decade, ending at the time of breast cancer diagnosis (MAR 2021) Hysterectomy? no BSO? no   SOCIAL HISTORY: (updated May 2022) Kara Kim worked in [employer] fund[employer], but is now retired. At home it's just she and her husband [name], who does custom woodworking.  They also have a 43 year old dog and 2 cats.  Their 2 sons are Kara Kim, 23, working for [employer] in [place], and Kara Kim 21, who will graduate from college a year from now.  He lives in [place]. The patient attends [employer] locally   ADVANCED DIRECTIVES: in the absence of any document to the contrary the patient's husband is her HCPOA   HEALTH MAINTENANCE: Social History   Tobacco Use   Smoking status: Never   Smokeless tobacco: Never  Vaping Use   Vaping Use: Never used  Substance Use Topics   Alcohol use: Yes    Alcohol/week: 4.0 standard drinks    Types: 4 Glasses of wine per week    Comment: occasional   Drug use: No     Colonoscopy: 09/2020 (Dr. Tarri Glenn), recall 2024  PAP: UTD/ [name]  Bone density:    Allergies  Allergen Reactions   Eggs Or Egg-Derived Products Swelling   Elemental Sulfur Other (See Comments)    [name] Syndrome   Sulfamethoxazole Rash    Current Outpatient Medications  Medication Sig Dispense Refill   cephALEXin (KEFLEX) 500 MG capsule      Cholecalciferol (VITAMIN D-3) 125 MCG (5000 UT)  TABS Take 5,000 Units by mouth daily.      fexofenadine (ALLEGRA ALLERGY) 180 MG tablet Allegra Allergy 180 mg tablet  Take 1 tablet every day by oral route.     fexofenadine (ALLEGRA) 180 MG tablet Take 180 mg by mouth daily.     fluticasone (FLONASE) 50 MCG/ACT nasal spray Place 2 sprays into both nostrils daily.     Multiple Vitamin (MULTIVITAMIN PO) Take by mouth daily.     Omega-3 Fatty Acids (FISH OIL PO) Take 2,000 mg by mouth daily.     ondansetron (ZOFRAN) 4 MG tablet Take 1 tablet (4 mg total) by mouth every 8 (eight) hours as needed for nausea or vomiting. 20 tablet 0   polyethylene glycol (MIRALAX / GLYCOLAX) 17 g packet Take 17 g by mouth daily.     Probiotic Product (PROBIOTIC DAILY PO) Take 1 tablet by mouth daily.     senna (SENOKOT) 8.6 MG tablet Take 1 tablet by mouth as needed for constipation.     tamoxifen (NOLVADEX) 20 MG tablet Take 1 tablet (20  mg total) by mouth daily. 90 tablet 4   thyroid (ARMOUR) 60 MG tablet Take 60 mg by mouth daily before breakfast.     vitamin k 100 MCG tablet Take 100 mcg by mouth daily.     No current facility-administered medications for this visit.    OBJECTIVE:  White woman who appears younger than stated age 25:   08/23/21 1209  BP: 120/77  Pulse: 72  Resp: 16  Temp: 97.9 F (36.6 C)  SpO2: 97%      Body mass index is 23.22 kg/m.   Wt Readings from Last 3 Encounters:  08/23/21 131 lb 1.6 oz (59.5 kg)  03/08/21 128 lb 4.8 oz (58.2 kg)  02/09/21 127 lb 13.9 oz (58 kg)      ECOG FS:1 - Symptomatic but completely ambulatory  Sclerae unicteric, EOMs intact Wearing a mask No cervical or supraclavicular adenopathy Lungs no rales or rhonchi Heart regular rate and rhythm Abd soft, nontender, positive bowel sounds MSK no focal spinal tenderness, no upper extremity lymphedema Neuro: nonfocal, well oriented, appropriate affect Breasts: The right breast is status postlumpectomy and radiation.  There is no evidence of local  recurrence.  The left breast is status post mastopexy.  Both axillae are benign   LAB RESULTS:  CMP     Component Value Date/Time   NA 140 02/12/2020 1200   K 4.1 02/12/2020 1200   CL 102 02/12/2020 1200   CO2 29 02/12/2020 1200   GLUCOSE 83 02/12/2020 1200   BUN 21 (H) 02/12/2020 1200   CREATININE 0.91 02/12/2020 1200   CREATININE 1.04 (H) 01/07/2020 1535   CALCIUM 9.5 02/12/2020 1200   PROT 6.4 (L) 01/07/2020 1535   ALBUMIN 3.9 01/07/2020 1535   AST 23 01/07/2020 1535   ALT 16 01/07/2020 1535   ALKPHOS 62 01/07/2020 1535   BILITOT 0.6 01/07/2020 1535   GFRNONAA >60 02/12/2020 1200   GFRNONAA 59 (L) 01/07/2020 1535   GFRAA >60 02/12/2020 1200   GFRAA >60 01/07/2020 1535    No results found for: TOTALPROTELP, ALBUMINELP, A1GS, A2GS, BETS, BETA2SER, GAMS, MSPIKE, SPEI  Lab Results  Component Value Date   WBC 4.5 02/12/2020   NEUTROABS 2.4 01/07/2020   HGB 14.3 02/12/2020   HCT 43.4 02/12/2020   MCV 99.3 02/12/2020   PLT 231 02/12/2020    No results found for: LABCA2  No components found for: DSKAJG811  No results for input(s): INR in the last 168 hours.  No results found for: LABCA2  No results found for: XBW620  No results found for: BTD974  No results found for: BUL845  No results found for: CA2729  No components found for: HGQUANT  No results found for: CEA1 / No results found for: CEA1   No results found for: AFPTUMOR  No results found for: CHROMOGRNA  No results found for: KPAFRELGTCHN, LAMBDASER, KAPLAMBRATIO (kappa/lambda light chains)  No results found for: HGBA, HGBA2QUANT, HGBFQUANT, HGBSQUAN (Hemoglobinopathy evaluation)   No results found for: LDH  No results found for: IRON, TIBC, IRONPCTSAT (Iron and TIBC)  No results found for: FERRITIN  Urinalysis No results found for: COLORURINE, APPEARANCEUR, LABSPEC, PHURINE, GLUCOSEU, HGBUR, BILIRUBINUR, KETONESUR, PROTEINUR, UROBILINOGEN, NITRITE, LEUKOCYTESUR   STUDIES: MR BREAST  BILATERAL W WO CONTRAST INC CAD  Result Date: 08/09/2021 CLINICAL DATA:  High risk screening. History right breast cancer in 2021 status post lumpectomy and radiation. LABS:  None. EXAM: BILATERAL BREAST MRI WITH AND WITHOUT CONTRAST TECHNIQUE: Multiplanar, multisequence MR images of both  breasts were obtained prior to and following the intravenous administration of 6 ml of Gadavist Three-dimensional MR images were rendered by post-processing of the original MR data on an independent workstation. The three-dimensional MR images were interpreted, and findings are reported in the following complete MRI report for this study. Three dimensional images were evaluated at the independent interpreting workstation using the DynaCAD thin client. COMPARISON:  Previous exam(s). FINDINGS: Breast composition: d. Extreme fibroglandular tissue. Background parenchymal enhancement: Minimal Right breast: Benign postsurgical changes in the upper outer right breast and right axilla. No mass or abnormal enhancement. Left breast: No mass or abnormal enhancement. Lymph nodes: No abnormal appearing lymph nodes. Ancillary findings:  None. IMPRESSION: No MRI evidence of malignancy in either breast. RECOMMENDATION: 1.  Continue routine annual screening mammography. 2. According to the 2018 ACR recommendations, annual surveillance MRI is recommended in women with personal histories of breast cancer and dense breast tissue, or those diagnosed before age 30. (Monticciolo DL, et al. (2018) Breast cancer screening in women at higher-than-average risk: recommendations from the ACR. [place] 62:130-865). BI-RADS CATEGORY  2: Benign. Electronically Signed   By: Audie Pinto M.D.   On: 08/09/2021 14:37    ELIGIBLE FOR AVAILABLE RESEARCH PROTOCOL: AET  ASSESSMENT: 60 y.o. [place] woman status post right breast upper outer quadrant biopsy 12/26/2019 for a clinical T1b NX, stage IA invasive ductal carcinoma, grade 2, estrogen and  progesterone receptor strongly positive, HER-2 not amplified, with an MIB-1 of 5%  (a) MRI obtained for further evaluation of category D breast density showed 2 additional areas of concern in the central right breast and 2 prominent low-lying right axillary lymph nodes.  The left was unremarkable  (b) biopsy of 1 additional area in the right breast on 01/30/2020 showed invasive ductal carcinoma, E-cadherin positive, grade 2, estrogen and progesterone receptor positive, with an MIB-1 of 1% and no HER-2 amplification  (1) right lumpectomy and sentinel lymph node sampling 02/19/2020 showed a pT1b pNo, stage IA invasive ductal carcinoma, grade 2, with close but negative margins.  (a) a total of 2 axillary lymph nodes were removed  (2) Oncotype score of 5 predicts a risk of recurrence outside the breast over the next 9 years of 3% if the patient's only systemic therapy is antiestrogens for 5 years.  It also predicts no benefit from adjuvant chemotherapy.  (3) adjuvant radiation to be completed 04/19/2020  (4) started tamoxifen 05/23/2020   PLAN: Brindley is now a year and a half from definitive surgery for her breast cancer with no evidence of disease recurrence.  This is very favorable.  She is tolerating tamoxifen well and the plan is to continue that a minimum of 5 years.  I think she would benefit from pelvic rehab and I placed the referral for her.  She continues to have extremely dense breasts.  She will need to repeat yearly MRI in addition to yearly mammography until the breast density is reduced.  She knows to call for any other issue that may develop before her next visit which will be in 1 year.  Total encounter time 25 minutes.Sarajane Jews C. Tamie Minteer, MD 08/23/2021 12:36 PM Medical Oncology and Hematology [facility] [address] [place], [place] [address] Tel. [phone]    Fax. [phone]   This document serves as a record of services personally performed by  Lurline Del, MD. It was created on his behalf by Wilburn Mylar, a trained medical scribe. The creation of  this record is based on the scribe's personal observations and the provider's statements to them.   I, Lurline Del MD, have reviewed the above documentation for accuracy and completeness, and I agree with the above.   *Total Encounter Time as defined by the Centers for Medicare and Medicaid Services includes, in addition to the face-to-face time of a patient visit (documented in the note above) non-face-to-face time: obtaining and reviewing outside history, ordering and reviewing medications, tests or procedures, care coordination (communications with other health care professionals or caregivers) and documentation in the medical record.

## 2021-08-23 ENCOUNTER — Inpatient Hospital Stay: Attending: Oncology | Admitting: Oncology

## 2021-08-23 ENCOUNTER — Other Ambulatory Visit: Payer: Self-pay

## 2021-08-23 VITALS — BP 120/77 | HR 72 | Temp 97.9°F | Resp 16 | Ht 63.0 in | Wt 131.1 lb

## 2021-08-23 DIAGNOSIS — Z801 Family history of malignant neoplasm of trachea, bronchus and lung: Secondary | ICD-10-CM | POA: Insufficient documentation

## 2021-08-23 DIAGNOSIS — Z170 Estrogen receptor positive status [ER+]: Secondary | ICD-10-CM

## 2021-08-23 DIAGNOSIS — C50411 Malignant neoplasm of upper-outer quadrant of right female breast: Secondary | ICD-10-CM

## 2021-08-23 MED ORDER — TAMOXIFEN CITRATE 20 MG PO TABS: 20.0000 mg | ORAL_TABLET | Freq: Every day | ORAL | 4 refills | Status: DC

## 2021-09-21 ENCOUNTER — Other Ambulatory Visit: Payer: Self-pay

## 2021-09-21 ENCOUNTER — Ambulatory Visit: Attending: Oncology | Admitting: Physical Therapy

## 2021-09-21 ENCOUNTER — Encounter: Payer: Self-pay | Admitting: Physical Therapy

## 2021-09-21 DIAGNOSIS — C50411 Malignant neoplasm of upper-outer quadrant of right female breast: Secondary | ICD-10-CM | POA: Diagnosis present

## 2021-09-21 DIAGNOSIS — R252 Cramp and spasm: Secondary | ICD-10-CM | POA: Insufficient documentation

## 2021-09-21 DIAGNOSIS — Z170 Estrogen receptor positive status [ER+]: Secondary | ICD-10-CM | POA: Insufficient documentation

## 2021-09-21 NOTE — Therapy (Signed)
[facility] [address] [place], [place], [address] Phone: [phone]   Fax:  [phone]  Physical Therapy Evaluation  Patient Details  Name: Kara Kim MRN: [id number] Date of Birth: 07-18-1961 Referring Provider (PT): Magrinat, Virgie Dad, MD  Encounter Date: 09/21/2021   PT End of Session - 09/21/21 1141     Visit Number 1    Date for PT Re-Evaluation 12/14/21    Authorization Type tricare    PT Start Time 1109   late   PT Stop Time 1139    PT Time Calculation (min) 30 min    Activity Tolerance Patient tolerated treatment well    Behavior During Therapy [facility] for tasks assessed/performed             Past Medical History:  Diagnosis Date   Anemia    Breast cancer ([place])    right   Complication of anesthesia    Family history of adverse reaction to anesthesia    mother with history N/V   Heart murmur    then another MD said no murmur   Mitral valve disorders(424.0) 07/29/2013   Osteopenia    PONV (postoperative nausea and vomiting)    Seasonal allergies    Thyroid disease     Past Surgical History:  Procedure Laterality Date   ANAL FISSURE REPAIR     BREAST LUMPECTOMY WITH RADIOACTIVE SEED AND SENTINEL LYMPH NODE BIOPSY Right 02/19/2020   Procedure: RIGHT BREAST LUMPECTOMY X 2  WITH RADIOACTIVE SEED AND SENTINEL LYMPH NODE BIOPSY;  Surgeon: [name], MD;  Location: [facility];  Service: General;  Laterality: Right;   CESAREAN SECTION     COLONOSCOPY     eye lid lift     MASTOPEXY Left 02/09/2021   Procedure: Left breast mastopexy for symmetry;  Surgeon: Wallace Going, DO;  Location: [facility];  Service: Plastics;  Laterality: Left;   tummy tuck      There were no vitals filed for this visit.    Subjective Assessment - 09/21/21 1118     Subjective Pt has pain and dryness in the vagina and pain with intercourse.  Tamoxofen causing this.  I have constipation from  tamoxofen.    Currently in Pain? No/denies                [facility] PT Assessment - 09/21/21 0001       Assessment   Medical Diagnosis C50.411,Z17.0 (ICD-10-CM) - Malignant neoplasm of upper-outer quadrant of right breast in female, estrogen receptor positive ([place])    Referring Provider (PT) Magrinat, Virgie Dad, MD      Precautions   Precautions None      Balance Screen   Has the patient fallen in the past 6 months No      [place] residence      Prior Function   Level of Independence Independent    Leisure yoga, body pump      Cognition   Overall Cognitive Status Within Functional Limits for tasks assessed      Posture/Postural Control   Posture/Postural Control No significant limitations      ROM / Strength   AROM / PROM / Strength PROM;AROM      AROM   Overall AROM Comments normal lumbar      PROM   Overall PROM Comments Rt hip ER 70%; Lt hip ER 80%      Flexibility  Soft Tissue Assessment /Muscle Length yes    Hamstrings Rt 80%      Palpation   Palpation comment normal                        Objective measurements completed on examination: See above findings.     Pelvic Floor Special Questions - 09/21/21 0001     Prior Pregnancies Yes    Currently Sexually Active Yes    Is this Painful Yes    Urinary Leakage No    Fecal incontinence No    Exam Type Deferred              [facility] Adult PT Treatment/Exercise - 09/21/21 0001       Self-Care   Self-Care Other Self-Care Comments    Other Self-Care Comments  toileting, moisture and samples of lubricants; intial HEP                     PT Education - 09/21/21 1141     Education Details Access Code: [email or web address]    Person(s) Educated Patient    Methods Explanation;Demonstration;Tactile cues;Verbal cues;Handout    Comprehension Verbalized understanding;Returned demonstration              PT Short Term Goals - 09/21/21 1143        PT SHORT TERM GOAL #1   Title Pt will be independent in HEP for self masage perineum    Time 4    Period Weeks    Status New    Target Date 10/19/21               PT [place] Term Goals - 09/21/21 1143       PT [place] TERM GOAL #1   Title Pt will be ind with advanced HEP for pelvic health    Time 12    Period Weeks    Status New    Target Date 12/14/21      PT [place] TERM GOAL #2   Title Pt will report easier BMs using toileting techniques    Time 12    Period Weeks    Status New    Target Date 12/14/21      PT [place] TERM GOAL #3   Title Pt will report intercourse is not painful    Time 12    Period Weeks    Status New    Target Date 12/14/21      PT [place] TERM GOAL #4   Title .Marland Kitchen      PT [place] TERM GOAL #5   Title .Marland KitchenMarland Kitchen                    Plan - 09/21/21 1205     Clinical Impression Statement Pt presents to clinic due to vaginal dryness and dyspareunia. She had breast cancer and now taking tamoxofen which is causing some symptoms as mentioned as well as constipation. Pt  has tight hamstring and hip ER Rt>Lt.  Pt has tension in pelvic floor as noted from symptoms and extensive hisotry taken today.  Pt was given education on what to do at home to restore vaginal health and soft tissue length.  Pt will do HEP for 3-4 weeks and return to re-assess and make any adjustments needed.    Examination-Participation Restrictions Community Activity;Interpersonal Relationship    Stability/Clinical Decision Making Stable/Uncomplicated    Clinical Decision Making Low    Rehab Potential Excellent  PT Frequency Biweekly    PT Duration 12 weeks    PT Treatment/Interventions ADLs/Self Care Home Management;Biofeedback;Cryotherapy;Electrical Stimulation;Moist Heat;Therapeutic activities;Therapeutic exercise;Neuromuscular re-education;Patient/family education;Dry needling;Passive range of motion;Taping    PT Next Visit Plan review stretches and self massage    PT Home Exercise  Plan Access Code: [email or web address], toileting, self massge, coconut oil    Consulted and Agree with Plan of Care Patient             Patient will benefit from skilled therapeutic intervention in order to improve the following deficits and impairments:  Impaired tone, Impaired flexibility, Increased muscle spasms  Visit Diagnosis: Cramp and spasm     Problem List Patient Active Problem List   Diagnosis Date Noted   Plantar fasciitis of right foot 02/08/2021   Osteopenia 12/15/2020   Breast asymmetry 09/21/2020   Malignant neoplasm of upper-outer quadrant of right breast in female, estrogen receptor positive ([place]) 01/07/2020   Encounter for counseling 09/08/2016   Elevated C-reactive protein (CRP) 07/29/2013    Class: Chronic    Jule Ser, PT 09/21/2021, 12:22 PM  [facility] @ [place] [address] [place], [place], [address] Phone: [phone]   Fax:  [phone]  Name: Kara Kim MRN: [id number] Date of Birth: Feb 15, 1961

## 2021-09-21 NOTE — Patient Instructions (Addendum)
STRETCHING THE PELVIC FLOOR MUSCLES NO DILATOR  Supplies Vaginal lubricant Mirror (optional) Gloves (optional) or clean hands Positioning Start in a semi-reclined position with your head propped up. Bend your knees and place your thumb or finger at the vaginal opening. Procedure Apply a moderate amount of lubricant on the outer skin of your vagina, the labia minora.  Apply additional lubricant to your finger. Spread the skin away from the vaginal opening. Place the end of your finger at the opening. Do a maximum contraction of the pelvic floor muscles. Tighten the vagina and the anus maximally and relax. When you know they are relaxed, gently and slowly insert your finger into your vagina, directing your finger slightly downward, for 2-3 inches of insertion. Relax and stretch the 6 o'clock position Hold each stretch for _30-60 seconds, no pain more than 3/10 Repeat the stretching in the 4 o'clock and 8 o'clock positions. Next gently move your finger in a "U" shape  several times.  You can also enter a second finger to work to spread the vaginal opening wider from 3:00-6:00 and 6:00-9:00 or 3:00-9:00 Perform daily or every other day Once you have accomplished the techniques you may try them in standing with one foot resting on the tub, or in other positions.  This is a good stretch to do in the shower if you don't need to use lubricant.   Moisturizers They are used in the vagina to hydrate the mucous membrane that make up the vaginal canal. Designed to keep a more normal acid balance (ph) Once placed in the vagina, it will last between two to three days.  Use 2-3 times per week at bedtime  Ingredients to avoid is glycerin and fragrance, can increase chance of infection Should not be used just before sex due to causing irritation Most are gels administered either in a tampon-shaped applicator or as a vaginal suppository. They are non-hormonal.   Types of Moisturizers(internal  use)  Vitamin E vaginal suppositories- Whole foods, Amazon Moist Again Coconut oil- can break down condoms Julva- (Do no use if on Tamoxifen) amazon Yes moisturizer- amazon NeuEve Silk , NeuEve Silver for menopausal or over 65 (if have severe vaginal atrophy or cancer treatments use NeuEve Silk for  1 month than move to [employer])- [employer], [place].[employer] Olive and Bee intimate cream- [email or web address].com.au Mae vaginal moisturizer- Amazon Aloe    Creams to use externally on the Vulva area Alber[employer]on's (good for for cancer patients that had radiation to the area)- [place] or [employer].[email or web address].br V-magic cream - amazon Julva-amazon Vital "V [place] salve ( help moisturize and help with thinning vulvar area, does have Annabella by Irwin Brakeman labial moisturizer (Amazon,  Coconut or olive oil aloe   Things to avoid in the vaginal area Do not use things to irritate the vulvar area No lotions just specialized creams for the vulva area- Neogyn, V-magic, No soaps; can use Aveeno or Calendula cleanser if needed. Must be gentle No deodorants No douches Good to sleep without underwear to let the vaginal area to air out No scrubbing: spread the lips to let warm water rinse over labias and pat dry   Access Code: [email or web address] URL: [email or web address] Date: 09/21/2021 Prepared by: Jari Favre  Exercises Happy Baby with Pelvic Floor Lengthening - 1 x daily - 7 x weekly - 1 sets - 3 reps - 30 hold Supine Butterfly Groin Stretch - 1 x daily - 7 x weekly -  1 sets - 3 reps - 30 sec hold Cat Cow to Child's Pose - 1 x daily - 7 x weekly - 1 sets - 5 reps - 10 sec hold Hooklying Hamstring Stretch with Strap - 1 x daily - 7 x weekly - 3 reps - 1 sets - 30 sec hold

## 2021-09-27 ENCOUNTER — Ambulatory Visit: Admitting: Physical Therapy

## 2021-10-25 ENCOUNTER — Encounter: Admitting: Physical Therapy

## 2021-11-08 ENCOUNTER — Encounter: Payer: Self-pay | Admitting: Interventional Cardiology

## 2021-11-10 NOTE — Progress Notes (Signed)
no

## 2021-11-11 ENCOUNTER — Encounter: Payer: Self-pay | Admitting: Interventional Cardiology

## 2021-11-11 ENCOUNTER — Ambulatory Visit (INDEPENDENT_AMBULATORY_CARE_PROVIDER_SITE_OTHER): Admitting: Interventional Cardiology

## 2021-11-11 ENCOUNTER — Other Ambulatory Visit: Payer: Self-pay

## 2021-11-11 ENCOUNTER — Ambulatory Visit (INDEPENDENT_AMBULATORY_CARE_PROVIDER_SITE_OTHER)

## 2021-11-11 VITALS — BP 118/81 | HR 55 | Ht 63.0 in | Wt 129.0 lb

## 2021-11-11 DIAGNOSIS — R002 Palpitations: Secondary | ICD-10-CM | POA: Diagnosis not present

## 2021-11-11 DIAGNOSIS — I440 Atrioventricular block, first degree: Secondary | ICD-10-CM | POA: Diagnosis not present

## 2021-11-11 DIAGNOSIS — R001 Bradycardia, unspecified: Secondary | ICD-10-CM

## 2021-11-11 DIAGNOSIS — R0609 Other forms of dyspnea: Secondary | ICD-10-CM

## 2021-11-11 DIAGNOSIS — R7982 Elevated C-reactive protein (CRP): Secondary | ICD-10-CM

## 2021-11-11 NOTE — Progress Notes (Unsigned)
Enrolled patient for a 14 day Zio XT  monitor to be mailed to patients home  °

## 2021-11-11 NOTE — Patient Instructions (Signed)
Medication Instructions:  Your physician recommends that you continue on your current medications as directed. Please refer to the Current Medication list given to you today.  *If you need a refill on your cardiac medications before your next appointment, please call your pharmacy*   Lab Work: None If you have labs (blood work) drawn today and your tests are completely normal, you will receive your results only by: [place] (if you have MyChart) OR A paper copy in the mail If you have any lab test that is abnormal or we need to change your treatment, we will call you to review the results.   Testing/Procedures: Your physician recommends that you wear a monitor for 2 weeks.  See information on next page.   Follow-Up: At [facility], you and your health needs are our priority.  As part of our continuing mission to provide you with exceptional heart care, we have created designated Provider Care Teams.  These Care Teams include your primary Cardiologist (physician) and Advanced Practice Providers (APPs -  Physician Assistants and Nurse Practitioners) who all work together to provide you with the care you need, when you need it.  We recommend signing up for the patient portal called "MyChart".  Sign up information is provided on this After Visit Summary.  MyChart is used to connect with patients for Virtual Visits (Telemedicine).  Patients are able to view lab/test results, encounter notes, upcoming appointments, etc.  Non-urgent messages can be sent to your provider as well.   To learn more about what you can do with MyChart, go to [email or web address].    Your next appointment:   As needed  The format for your next appointment:   In Person  Provider:   [facility]. Blenda Bridegroom, MD   Other Instructions  Bryn Gulling- [place] Term Monitor Instructions  Your physician has requested you wear a ZIO patch monitor for 14 days.  This is a single patch monitor. Irhythm supplies one patch  monitor per enrollment. Additional stickers are not available. Please do not apply patch if you will be having a Nuclear Stress Test,  Echocardiogram, Cardiac CT, MRI, or Chest Xray during the period you would be wearing the  monitor. The patch cannot be worn during these tests. You cannot remove and re-apply the  ZIO XT patch monitor.  Your ZIO patch monitor will be mailed 3 day USPS to your address on file. It may take 3-5 days  to receive your monitor after you have been enrolled.  Once you have received your monitor, please review the enclosed instructions. Your monitor  has already been registered assigning a specific monitor serial # to you.  Billing and Patient Assistance Program Information  We have supplied [employer] with any of your insurance information on file for billing purposes. Ir[employer] offers a sliding scale Patient Assistance Program for patients that do not have  insurance, or whose insurance does not completely cover the cost of the ZIO monitor.  You must apply for the Patient Assistance Program to qualify for this discounted rate.  To apply, please call Ir[employer] at [phone], select option 4, select option 2, ask to apply for  Patient Assistance Program. Theodore Demark will ask your household income, and how many people  are in your household. They will quote your out-of-pocket cost based on that information.  Irhythm will also be able to set up a 60-month, interest-free payment plan if needed.  Applying the monitor   Shave hair from upper left chest.  Hold  abrader disc by orange tab. Rub abrader in 40 strokes over the upper left chest as  indicated in your monitor instructions.  Clean area with 4 enclosed alcohol pads. Let dry.  Apply patch as indicated in monitor instructions. Patch will be placed under collarbone on left  side of chest with arrow pointing upward.  Rub patch adhesive wings for 2 minutes. Remove white label marked "1". Remove the white  label marked "2".  Rub patch adhesive wings for 2 additional minutes.  While looking in a mirror, press and release button in center of patch. A small green light will  flash 3-4 times. This will be your only indicator that the monitor has been turned on.  Do not shower for the first 24 hours. You may shower after the first 24 hours.  Press the button if you feel a symptom. You will hear a small click. Record Date, Time and  Symptom in the Patient Logbook.  When you are ready to remove the patch, follow instructions on the last 2 pages of Patient  Logbook. Stick patch monitor onto the last page of Patient Logbook.  Place Patient Logbook in the blue and white box. Use locking tab on box and tape box closed  securely. The blue and white box has prepaid postage on it. Please place it in the mailbox as  soon as possible. Your physician should have your test results approximately 7 days after the  monitor has been mailed back to [facility].  Call [place] at [phone] if you have questions regarding  your ZIO XT patch monitor. Call them immediately if you see an orange light blinking on your  monitor.  If your monitor falls off in less than 4 days, contact our Monitor department at [phone].  If your monitor becomes loose or falls off after 4 days call [employer] at [phone] for  suggestions on securing your monitor

## 2021-11-11 NOTE — Progress Notes (Signed)
Cardiology Office Note:    Date:  11/11/2021   ID:  Kara Kim, DOB [birth year], MRN [id number]  PCP:  Patient, No Pcp Per (Inactive)  Cardiologist:  None   Referring MD: No ref. provider found   Chief Complaint  Patient presents with   Shortness of Breath    History of Present Illness:    Kara Kim is a 62 y.o. female with a hx of vasovagal syncope and young adult life, high-sensitivity CRP elevation, breast cancer with radiation, prior history of mitral valve disorder not confirmed by echo 2014, who presents now with increasing palpitations and bradycardia.   Physically active although feels that her exertional tolerance is not as good as it should be.  She has not had syncope.  No prolonged tachycardia.  Her smart watch identifies heart rates in the 30s at times.  Slow heart rates are at night when she is trying to sleep.  Smart watch will notify her of slow heart rates.  She denies chest pain.  Denies exertional dyspnea.  Had an episode of atrial fib in her 54s.  This occurred when she was walking on a treadmill during an executive physical.  Says the treadmill has been done because she had slow heart rate.  Past Medical History:  Diagnosis Date   Anemia    Breast cancer ([place])    right   Complication of anesthesia    Family history of adverse reaction to anesthesia    mother with history N/V   Heart murmur    then another MD said no murmur   Mitral valve disorders(424.0) 07/29/2013   Osteopenia    PONV (postoperative nausea and vomiting)    Seasonal allergies    Thyroid disease     Past Surgical History:  Procedure Laterality Date   ANAL FISSURE REPAIR     BREAST LUMPECTOMY WITH RADIOACTIVE SEED AND SENTINEL LYMPH NODE BIOPSY Right 02/19/2020   Procedure: RIGHT BREAST LUMPECTOMY X 2  WITH RADIOACTIVE SEED AND SENTINEL LYMPH NODE BIOPSY;  Surgeon: [name], MD;  Location: [place];  Service: General;  Laterality: Right;   CESAREAN SECTION     COLONOSCOPY      eye lid lift     MASTOPEXY Left 02/09/2021   Procedure: Left breast mastopexy for symmetry;  Surgeon: Wallace Going, DO;  Location: [place];  Service: Plastics;  Laterality: Left;   tummy tuck      Current Medications: Current Meds  Medication Sig   Cholecalciferol (VITAMIN D-3) 125 MCG (5000 UT) TABS Take 5,000 Units by mouth daily.    fexofenadine (ALLEGRA) 180 MG tablet Take 180 mg by mouth daily.   fluticasone (FLONASE) 50 MCG/ACT nasal spray Place 2 sprays into both nostrils daily.   Multiple Vitamin (MULTIVITAMIN PO) Take by mouth daily.   Omega-3 Fatty Acids (FISH OIL PO) Take 2,000 mg by mouth daily.   ondansetron (ZOFRAN) 4 MG tablet Take 1 tablet (4 mg total) by mouth every 8 (eight) hours as needed for nausea or vomiting.   OVER THE COUNTER MEDICATION Take 1 tablet by mouth daily. Pt takes Berberine 1 tablet daily.   polyethylene glycol (MIRALAX / GLYCOLAX) 17 g packet Take 17 g by mouth daily.   Probiotic Product (PROBIOTIC DAILY PO) Take 1 tablet by mouth daily.   senna (SENOKOT) 8.6 MG tablet Take 1 tablet by mouth as needed for constipation.   tamoxifen (NOLVADEX) 20 MG tablet Take 1 tablet (20 mg total) by  mouth daily.   thyroid (ARMOUR) 60 MG tablet Take 60 mg by mouth daily before breakfast.   triamcinolone cream (KENALOG) 0.1 % as needed.   vitamin k 100 MCG tablet Take 100 mcg by mouth daily.     Allergies:   Eggs or egg-derived products, Elemental sulfur, Lac bovis, Sulfamethoxazole, and Wheat bran   Social History   Socioeconomic History   Marital status: Married    Spouse name: Not on file   Number of children: Not on file   Years of education: Not on file   Highest education level: Not on file  Occupational History   Not on file  Tobacco Use   Smoking status: Never   Smokeless tobacco: Never  Vaping Use   Vaping Use: Never used  Substance and Sexual Activity   Alcohol use: Yes    Alcohol/week: 4.0 standard drinks     Types: 4 Glasses of wine per week    Comment: occasional   Drug use: No   Sexual activity: Not on file  Other Topics Concern   Not on file  Social History Narrative   Not on file   Social Determinants of Health   Financial Resource Strain: Not on file  Food Insecurity: Not on file  Transportation Needs: Not on file  Physical Activity: Not on file  Stress: Not on file  Social Connections: Not on file     Family History: The patient's family history includes Hypertension in her father and sister; Lung cancer in her mother; Seizures in her son. There is no history of Colon cancer, Esophageal cancer, Rectal cancer, or Stomach cancer.  Father had aortic aneurysm and coronary disease.  ROS:   Please see the history of present illness.    Had COVID-19 late last year.  Has also had breast cancer in the status post radiation.  Has concerns that these 2 issues could be causing heart problems.  All other systems reviewed and are negative.  EKGs/Labs/Other Studies Reviewed:    The following studies were reviewed today: No new or recent cardiac imaging  EKG:  EKG Sinus rhythm, first-degree AV block with PR interval 222 ms, prominent voltage, small Q waves in inferior and anterolateral not felt to be pathologic.  Recent Labs: No results found for requested labs within last 8760 hours.  Recent Lipid Panel No results found for: CHOL, TRIG, HDL, CHOLHDL, VLDL, LDLCALC, LDLDIRECT  Physical Exam:    VS:  Pulse (!) 55    Ht 5\' 3"  (1.6 m)    Wt 129 lb (58.5 kg)    SpO2 98%    BMI 22.85 kg/m     Wt Readings from Last 3 Encounters:  11/11/21 129 lb (58.5 kg)  08/23/21 131 lb 1.6 oz (59.5 kg)  03/08/21 128 lb 4.8 oz (58.2 kg)     GEN: Young appearing 61 year old.. No acute distress HEENT: Normal NECK: No JVD. LYMPHATICS: No lymphadenopathy CARDIAC: No murmur. RRR no gallop, or edema. VASCULAR:  Normal Pulses. No bruits. RESPIRATORY:  Clear to auscultation without rales, wheezing or  rhonchi  ABDOMEN: Soft, non-tender, non-distended, No pulsatile mass, MUSCULOSKELETAL: No deformity  SKIN: Warm and dry NEUROLOGIC:  Alert and oriented x 3 PSYCHIATRIC:  Normal affect   ASSESSMENT:    1. DOE (dyspnea on exertion)   2. Bradycardia   3. Palpitations   4. First degree AV block    PLAN:    In order of problems listed above:  Uncertain etiology.  No clinical clues on  exam of heart failure. Bradycardia and palpitations will be evaluated with a 2-week monitor. She has not had sustained rapid heartbeat that she can tell. Monitor will help exclude high-grade AV block.  This may be occurring at night when she registers heart rates in the 30s.   2-week monitor with further evaluation dependent upon findings.   Medication Adjustments/Labs and Tests Ordered: Current medicines are reviewed at length with the patient today.  Concerns regarding medicines are outlined above.  Orders Placed This Encounter  Procedures   [place] TERM MONITOR (3-14 DAYS)   EKG 12-Lead   No orders of the defined types were placed in this encounter.   Patient Instructions  Medication Instructions:  Your physician recommends that you continue on your current medications as directed. Please refer to the Current Medication list given to you today.  *If you need a refill on your cardiac medications before your next appointment, please call your pharmacy*   Lab Work: None If you have labs (blood work) drawn today and your tests are completely normal, you will receive your results only by: [place] (if you have MyChart) OR A paper copy in the mail If you have any lab test that is abnormal or we need to change your treatment, we will call you to review the results.   Testing/Procedures: Your physician recommends that you wear a monitor for 2 weeks.  See information on next page.   Follow-Up: At [facility], you and your health needs are our priority.  As part of our continuing  mission to provide you with exceptional heart care, we have created designated Provider Care Teams.  These Care Teams include your primary Cardiologist (physician) and Advanced Practice Providers (APPs -  Physician Assistants and Nurse Practitioners) who all work together to provide you with the care you need, when you need it.  We recommend signing up for the patient portal called "MyChart".  Sign up information is provided on this After Visit Summary.  MyChart is used to connect with patients for Virtual Visits (Telemedicine).  Patients are able to view lab/test results, encounter notes, upcoming appointments, etc.  Non-urgent messages can be sent to your provider as well.   To learn more about what you can do with MyChart, go to [email or web address].    Your next appointment:   As needed  The format for your next appointment:   In Person  Provider:   [facility]. Blenda Bridegroom, MD   Other Instructions  Bryn Gulling- [place] Term Monitor Instructions  Your physician has requested you wear a ZIO patch monitor for 14 days.  This is a single patch monitor. [employer] supplies one patch monitor per enrollment. Additional stickers are not available. Please do not apply patch if you will be having a Nuclear Stress Test,  Echocardiogram, Cardiac CT, MRI, or Chest Xray during the period you would be wearing the  monitor. The patch cannot be worn during these tests. You cannot remove and re-apply the  ZIO XT patch monitor.  Your ZIO patch monitor will be mailed 3 day USPS to your address on file. It may take 3-5 days  to receive your monitor after you have been enrolled.  Once you have received your monitor, please review the enclosed instructions. Your monitor  has already been registered assigning a specific monitor serial # to you.  Billing and Patient Assistance Program Information  We have supplied [employer] with any of your insurance information on file for billing purposes. [employer] offers a sliding  scale Patient Assistance Program for patients that do not have  insurance, or whose insurance does not completely cover the cost of the ZIO monitor.  You must apply for the Patient Assistance Program to qualify for this discounted rate.  To apply, please call [employer] at [phone], select option 4, select option 2, ask to apply for  Patient Assistance Program. Theodore Demark will ask your household income, and how many people  are in your household. They will quote your out-of-pocket cost based on that information.  [employer] will also be able to set up a 15-month, interest-free payment plan if needed.  Applying the monitor   Shave hair from upper left chest.  Hold abrader disc by orange tab. Rub abrader in 40 strokes over the upper left chest as  indicated in your monitor instructions.  Clean area with 4 enclosed alcohol pads. Let dry.  Apply patch as indicated in monitor instructions. Patch will be placed under collarbone on left  side of chest with arrow pointing upward.  Rub patch adhesive wings for 2 minutes. Remove white label marked "1". Remove the white  label marked "2". Rub patch adhesive wings for 2 additional minutes.  While looking in a mirror, press and release button in center of patch. A small green light will  flash 3-4 times. This will be your only indicator that the monitor has been turned on.  Do not shower for the first 24 hours. You may shower after the first 24 hours.  Press the button if you feel a symptom. You will hear a small click. Record Date, Time and  Symptom in the Patient Logbook.  When you are ready to remove the patch, follow instructions on the last 2 pages of Patient  Logbook. Stick patch monitor onto the last page of Patient Logbook.  Place Patient Logbook in the blue and white box. Use locking tab on box and tape box closed  securely. The blue and white box has prepaid postage on it. Please place it in the mailbox as  soon as possible. Your physician should  have your test results approximately 7 days after the  monitor has been mailed back to [facility].  Call [place] at [phone] if you have questions regarding  your ZIO XT patch monitor. Call them immediately if you see an orange light blinking on your  monitor.  If your monitor falls off in less than 4 days, contact our Monitor department at [phone].  If your monitor becomes loose or falls off after 4 days call [employer] at [phone] for  suggestions on securing your monitor     Signed, Sinclair Grooms, MD  11/11/2021 3:17 PM    [place]

## 2021-11-16 DIAGNOSIS — R001 Bradycardia, unspecified: Secondary | ICD-10-CM

## 2021-11-16 DIAGNOSIS — R002 Palpitations: Secondary | ICD-10-CM | POA: Diagnosis not present

## 2021-12-07 ENCOUNTER — Ambulatory Visit: Admitting: Interventional Cardiology

## 2021-12-08 ENCOUNTER — Telehealth: Payer: Self-pay | Admitting: Interventional Cardiology

## 2021-12-08 DIAGNOSIS — R001 Bradycardia, unspecified: Secondary | ICD-10-CM

## 2021-12-08 DIAGNOSIS — I495 Sick sinus syndrome: Secondary | ICD-10-CM

## 2021-12-08 NOTE — Telephone Encounter (Signed)
Zio is calling with abnormal monitor results

## 2021-12-08 NOTE — Telephone Encounter (Signed)
[place] term monitor results in patient chart to be reviewed by MD Tamala Julian.  Symptomatic brady strips is reason for call and report. Strips 9 & 10 page 17. HR 39 bpm for 30 seconds. Then HR 40 bpm for 30 seconds.

## 2021-12-09 NOTE — Progress Notes (Unsigned)
Enrolled patient for a 14 day Zio XT  monitor to be mailed to patients home  °

## 2021-12-12 NOTE — Telephone Encounter (Signed)
Spoke with pt and reviewed monitor results and recommendations per Dr. Tamala Julian.  Referral placed for EP dept.  Pt verbalized understanding and was in agreement with plan.

## 2021-12-15 ENCOUNTER — Other Ambulatory Visit: Payer: Self-pay | Admitting: Hematology and Oncology

## 2021-12-15 ENCOUNTER — Other Ambulatory Visit: Payer: Self-pay | Admitting: Oncology

## 2021-12-15 DIAGNOSIS — C50411 Malignant neoplasm of upper-outer quadrant of right female breast: Secondary | ICD-10-CM

## 2021-12-15 DIAGNOSIS — Z9889 Other specified postprocedural states: Secondary | ICD-10-CM

## 2021-12-15 DIAGNOSIS — Z170 Estrogen receptor positive status [ER+]: Secondary | ICD-10-CM

## 2021-12-16 ENCOUNTER — Ambulatory Visit: Admitting: Interventional Cardiology

## 2022-02-06 ENCOUNTER — Ambulatory Visit
Admission: RE | Admit: 2022-02-06 | Discharge: 2022-02-06 | Disposition: A | Discharge status: Home / Self Care | Source: Ambulatory Visit | Attending: Hematology and Oncology | Admitting: Hematology and Oncology

## 2022-02-06 ENCOUNTER — Institutional Professional Consult (permissible substitution): Admitting: Internal Medicine

## 2022-02-06 ENCOUNTER — Encounter: Payer: Self-pay | Admitting: Internal Medicine

## 2022-02-06 ENCOUNTER — Ambulatory Visit: Admitting: Internal Medicine

## 2022-02-06 DIAGNOSIS — I495 Sick sinus syndrome: Secondary | ICD-10-CM | POA: Insufficient documentation

## 2022-02-06 DIAGNOSIS — Z9889 Other specified postprocedural states: Secondary | ICD-10-CM

## 2022-02-06 MED ORDER — METOPROLOL TARTRATE 25 MG PO TABS: ORAL_TABLET | ORAL | 3 refills | Status: DC

## 2022-02-06 NOTE — Progress Notes (Signed)
? ? ? ? ?HPI ?Kara Kim is referred by Dr. Tamala Julian for evaluation of palpitations. She has a remote h/o MVP and an echo in 2014 did not substantiate this diagnosis. She has a h/o breast CA and noted worsening palpitations after treatment. She notes that her apple watch demonstrates bradycardia when she sleeps and she wore a cardiac monitor which demonstrated early morning pauses (sleeping) as well as NS SVT and PAC's and PVC's.  She notes a remote h/o syncope which is thought to be vagally mediated and controlled.  ?Allergies  ?Allergen Reactions  ? Eggs Or Egg-Derived Products Swelling  ? Elemental Sulfur Other (See Comments)  ?  [name] Syndrome  ? Lac Bovis   ?  Other reaction(s): Abdominal Pain  ? Sulfamethoxazole Rash  ? Wheat Bran   ?  Other reaction(s): Abdominal Pain  ? ? ? ?Current Outpatient Medications  ?Medication Sig Dispense Refill  ? Cholecalciferol (VITAMIN D-3) 125 MCG (5000 UT) TABS Take 5,000 Units by mouth daily.     ? fexofenadine (ALLEGRA) 180 MG tablet Take 180 mg by mouth daily.    ? fluticasone (FLONASE) 50 MCG/ACT nasal spray Place 2 sprays into both nostrils daily.    ? metoprolol tartrate (LOPRESSOR) 25 MG tablet Take one tablet by mouth daily as needed for palpitations 90 tablet 3  ? Multiple Vitamin (MULTIVITAMIN PO) Take by mouth daily.    ? Omega-3 Fatty Acids (FISH OIL PO) Take 2,000 mg by mouth daily.    ? OVER THE COUNTER MEDICATION Take 1 tablet by mouth daily. Pt takes Berberine 1 tablet daily.    ? polyethylene glycol (MIRALAX / GLYCOLAX) 17 g packet Take 17 g by mouth daily.    ? Probiotic Product (PROBIOTIC DAILY PO) Take 1 tablet by mouth daily.    ? senna (SENOKOT) 8.6 MG tablet Take 1 tablet by mouth as needed for constipation.    ? tamoxifen (NOLVADEX) 20 MG tablet Take 1 tablet (20 mg total) by mouth daily. 90 tablet 4  ? thyroid (ARMOUR) 60 MG tablet Take 60 mg by mouth daily before breakfast.    ? triamcinolone cream (KENALOG) 0.1 % as needed.    ? vitamin k 100  MCG tablet Take 100 mcg by mouth daily.    ? ?No current facility-administered medications for this visit.  ? ? ? ?Past Medical History:  ?Diagnosis Date  ? Anemia   ? Breast cancer ([place])   ? right  ? Complication of anesthesia   ? Family history of adverse reaction to anesthesia   ? mother with history N/V  ? Heart murmur   ? then another MD said no murmur  ? Mitral valve disorders(424.0) 07/29/2013  ? Osteopenia   ? PONV (postoperative nausea and vomiting)   ? Seasonal allergies   ? Thyroid disease   ? ? ?ROS: ? ? All systems reviewed and negative except as noted in the HPI. ? ? ?Past Surgical History:  ?Procedure Laterality Date  ? ANAL FISSURE REPAIR    ? BREAST BIOPSY Right 12/2019  ? BREAST BIOPSY Right 01/2020  ? BREAST LUMPECTOMY Right 01/2020  ? BREAST LUMPECTOMY WITH RADIOACTIVE SEED AND SENTINEL LYMPH NODE BIOPSY Right 02/19/2020  ? Procedure: RIGHT BREAST LUMPECTOMY X 2  WITH RADIOACTIVE SEED AND SENTINEL LYMPH NODE BIOPSY;  Surgeon: [name], MD;  Location: [facility];  Service: General;  Laterality: Right;  ? CESAREAN SECTION    ? COLONOSCOPY    ? eye lid lift    ?  MASTOPEXY Left 02/09/2021  ? Procedure: Left breast mastopexy for symmetry;  Surgeon: Wallace Going, DO;  Location: [facility];  Service: Plastics;  Laterality: Left;  ? tummy tuck    ? ? ? ?Family History  ?Problem Relation Age of Onset  ? Lung cancer Mother   ? Hypertension Father   ? Hypertension Sister   ? Seizures Son   ? Colon cancer Neg Hx   ? Esophageal cancer Neg Hx   ? Rectal cancer Neg Hx   ? Stomach cancer Neg Hx   ? ? ? ?Social History  ? ?Socioeconomic History  ? Marital status: Married  ?  Spouse name: Not on file  ? Number of children: Not on file  ? Years of education: Not on file  ? Highest education level: Not on file  ?Occupational History  ? Not on file  ?Tobacco Use  ? Smoking status: Never  ? Smokeless tobacco: Never  ?Vaping Use  ? Vaping Use: Never used  ?Substance and Sexual Activity  ? Alcohol  use: Yes  ?  Alcohol/week: 4.0 standard drinks  ?  Types: 4 Glasses of wine per week  ?  Comment: occasional  ? Drug use: No  ? Sexual activity: Not on file  ?Other Topics Concern  ? Not on file  ?Social History Narrative  ? Not on file  ? ?Social Determinants of Health  ? ?Financial Resource Strain: Not on file  ?Food Insecurity: Not on file  ?Transportation Needs: Not on file  ?Physical Activity: Not on file  ?Stress: Not on file  ?Social Connections: Not on file  ?Intimate Partner Violence: Not on file  ? ? ? ?BP 120/70   Pulse (!) 56   Ht '5\' 3"'$  (1.6 m)   Wt 127 lb (57.6 kg)   SpO2 98%   BMI 22.50 kg/m?  ? ?Physical Exam: ? ?Well appearing middle aged woman, NAD ?HEENT: Unremarkable ?Neck:  No JVD, no thyromegally ?Lymphatics:  No adenopathy ?Back:  No CVA tenderness ?Lungs:  Clear with no wheezes ?HEART:  Regular rate rhythm, no murmurs, no rubs, no clicks ?Abd:  soft, positive bowel sounds, no organomegally, no rebound, no guarding ?Ext:  2 plus pulses, no edema, no cyanosis, no clubbing ?Skin:  No rashes no nodules ?Neuro:  CN II through XII intact, motor grossly intact ? ?EKG - reviewed. NSR ? ? ?Assess/Plan: ?Palpitations -I have reviewed the etiology and discussed the benign nature of her symptoms. I would not recommend any change in her treatment unless her symptoms worsen. I encouraged her to avoid excess ETOH/caffeine and get adequate sleep.  ?Syncope - Her symptoms are resolved/well controlled.  ?PAC's/PVC's - I discussed the benign nature of the problem and we will avoid AA drug therapy for now. ? ?Carleene Overlie Devina Bezold,MD ?

## 2022-02-06 NOTE — Patient Instructions (Addendum)
Medication Instructions:  ?Your physician has recommended you make the following change in your medication:  ? ? TAKE metoprolol tartrate 25 mg-  Take one tablet by mouth as needed for palpitations. ? ?Labwork: ?None ordered. ? ?Testing/Procedures: ?None ordered. ? ?Follow-Up: ?Your physician wants you to follow-up in: one year with Cristopher Peru, MD  ?You will receive a reminder letter in the mail two months in advance. If you don't receive a letter, please call our office to schedule the follow-up appointment. ? ?Any Other Special Instructions Will Be Listed Below (If Applicable). ? ?If you need a refill on your cardiac medications before your next appointment, please call your pharmacy.  ? ?Important Information About Sugar ? ? ? ? ? ? ? ?

## 2022-03-09 ENCOUNTER — Ambulatory Visit: Admitting: Hematology and Oncology

## 2022-03-09 ENCOUNTER — Other Ambulatory Visit

## 2022-06-07 ENCOUNTER — Ambulatory Visit: Admitting: Interventional Cardiology

## 2022-06-19 ENCOUNTER — Encounter: Payer: Self-pay | Admitting: Interventional Cardiology

## 2022-06-19 ENCOUNTER — Ambulatory Visit: Attending: Interventional Cardiology | Admitting: Interventional Cardiology

## 2022-06-19 VITALS — BP 102/76 | HR 64 | Ht 63.0 in | Wt 124.0 lb

## 2022-06-19 DIAGNOSIS — I495 Sick sinus syndrome: Secondary | ICD-10-CM

## 2022-06-19 DIAGNOSIS — R001 Bradycardia, unspecified: Secondary | ICD-10-CM

## 2022-06-19 DIAGNOSIS — R002 Palpitations: Secondary | ICD-10-CM

## 2022-06-19 DIAGNOSIS — I440 Atrioventricular block, first degree: Secondary | ICD-10-CM

## 2022-06-19 NOTE — Patient Instructions (Signed)
Medication Instructions:  Your physician recommends that you continue on your current medications as directed. Please refer to the Current Medication list given to you today.  *If you need a refill on your cardiac medications before your next appointment, please call your pharmacy*   Lab Work: NONE If you have labs (blood work) drawn today and your tests are completely normal, you will receive your results only by: [place] (if you have MyChart) OR A paper copy in the mail If you have any lab test that is abnormal or we need to change your treatment, we will call you to review the results.   Testing/Procedures: NONE   Follow-Up: Dr. Lovena Le (call in January 2024 to make follow-up appointment in April 2024).  Important Information About Sugar

## 2022-06-19 NOTE — Progress Notes (Signed)
Cardiology Office Note:    Date:  06/19/2022   ID:  Kara Kim, DOB 03/10/1961, MRN [id number]  PCP:  Patient, No Pcp Per  Cardiologist:  None   Referring MD: No ref. provider found   Chief Complaint  Patient presents with   Coronary Artery Disease   Follow-up    Tachycardia bradycardia asymptomatic.    History of Present Illness:    Kara Kim is a 61 y.o. female with a hx of vasovagal syncope and young adult life, high-sensitivity CRP elevation, breast cancer with radiation, prior history of mitral valve disorder not confirmed by echo 2014, who presents now with increasing palpitations and bradycardia.   She is doing well.  Denies palpitations, syncope, chest pain, or other cardiac complaints.  Saw Dr. Lovena Le for tachybradycardia and conservative management was recommended.  She has metoprolol to tartrate to use if sustained tachycardia.  Past Medical History:  Diagnosis Date   Anemia    Breast cancer ([place])    right   Complication of anesthesia    Family history of adverse reaction to anesthesia    mother with history N/V   Heart murmur    then another MD said no murmur   Mitral valve disorders(424.0) 07/29/2013   Osteopenia    PONV (postoperative nausea and vomiting)    Seasonal allergies    Thyroid disease     Past Surgical History:  Procedure Laterality Date   ANAL FISSURE REPAIR     BREAST BIOPSY Right 12/2019   BREAST BIOPSY Right 01/2020   BREAST LUMPECTOMY Right 01/2020   BREAST LUMPECTOMY WITH RADIOACTIVE SEED AND SENTINEL LYMPH NODE BIOPSY Right 02/19/2020   Procedure: RIGHT BREAST LUMPECTOMY X 2  WITH RADIOACTIVE SEED AND SENTINEL LYMPH NODE BIOPSY;  Surgeon: [name], MD;  Location: [facility];  Service: General;  Laterality: Right;   CESAREAN SECTION     COLONOSCOPY     eye lid lift     MASTOPEXY Left 02/09/2021   Procedure: Left breast mastopexy for symmetry;  Surgeon: Wallace Going, DO;  Location: [facility];   Service: Plastics;  Laterality: Left;   tummy tuck      Current Medications: Current Meds  Medication Sig   Cholecalciferol (VITAMIN D-3) 125 MCG (5000 UT) TABS Take 5,000 Units by mouth daily.    fexofenadine (ALLEGRA) 180 MG tablet Take 180 mg by mouth daily.   fluticasone (FLONASE) 50 MCG/ACT nasal spray Place 2 sprays into both nostrils daily.   metoprolol tartrate (LOPRESSOR) 25 MG tablet Take one tablet by mouth daily as needed for palpitations   Multiple Vitamin (MULTIVITAMIN PO) Take by mouth daily.   Omega-3 Fatty Acids (FISH OIL PO) Take 2,000 mg by mouth daily.   OVER THE COUNTER MEDICATION Take 1 tablet by mouth daily. Pt takes Berberine 1 tablet daily.   polyethylene glycol (MIRALAX / GLYCOLAX) 17 g packet Take 17 g by mouth daily.   Probiotic Product (PROBIOTIC DAILY PO) Take 1 tablet by mouth daily.   senna (SENOKOT) 8.6 MG tablet Take 1 tablet by mouth as needed for constipation.   tamoxifen (NOLVADEX) 20 MG tablet Take 1 tablet (20 mg total) by mouth daily.   thyroid (ARMOUR) 60 MG tablet Take 60 mg by mouth daily before breakfast.   triamcinolone cream (KENALOG) 0.1 % as needed.   vitamin k 100 MCG tablet Take 100 mcg by mouth daily.     Allergies:   Eggs or egg-derived products, Elemental sulfur,  Milk (cow), Sulfamethoxazole, and Wheat bran   Social History   Socioeconomic History   Marital status: Married    Spouse name: Not on file   Number of children: Not on file   Years of education: Not on file   Highest education level: Not on file  Occupational History   Not on file  Tobacco Use   Smoking status: Never   Smokeless tobacco: Never  Vaping Use   Vaping Use: Never used  Substance and Sexual Activity   Alcohol use: Yes    Alcohol/week: 4.0 standard drinks of alcohol    Types: 4 Glasses of wine per week    Comment: occasional   Drug use: No   Sexual activity: Not on file  Other Topics Concern   Not on file  Social History Narrative   Not on file    Social Determinants of Health   Financial Resource Strain: Not on file  Food Insecurity: Not on file  Transportation Needs: Not on file  Physical Activity: Not on file  Stress: Not on file  Social Connections: Not on file     Family History: The patient's family history includes Hypertension in her father and sister; Lung cancer in her mother; Seizures in her son. There is no history of Colon cancer, Esophageal cancer, Rectal cancer, or Stomach cancer.  ROS:   Please see the history of present illness.    None.  Exercises religiously.  No complaints.  Recent trip to [place] and [place].  All other systems reviewed and are negative.  EKGs/Labs/Other Studies Reviewed:    The following studies were reviewed today:  2-week monitor 12/12/2021: Study Highlights    Normal sinus rhythm Rare PAC and PVC activity noted. Non sustained SVT correlating with one symptomatic episode. One episode 5 beat VT. No atrial fibrillation, pauses, or high grade AVB. Sinus bradycardia at 37 bpm 7:40 AM 11/20/2021   Suggest clinical correlation.    EKG:  EKG not repeated.   EKG January 2023 reveal sinus bradycardia with first-degree AV block.  Recent Labs: No results found for requested labs within last 365 days.  Recent Lipid Panel No results found for: "CHOL", "TRIG", "HDL", "CHOLHDL", "VLDL", "LDLCALC", "LDLDIRECT"  Physical Exam:    VS:  BP 102/76   Pulse 64   Ht '5\' 3"'$  (1.6 m)   Wt 124 lb (56.2 kg)   SpO2 98%   BMI 21.97 kg/m     Wt Readings from Last 3 Encounters:  06/19/22 124 lb (56.2 kg)  02/06/22 127 lb (57.6 kg)  11/11/21 129 lb (58.5 kg)     GEN: Healthy in appearance. No acute distress HEENT: Normal NECK: No JVD. LYMPHATICS: No lymphadenopathy CARDIAC: No murmur. RRR no gallop, or edema. VASCULAR:  Normal Pulses. No bruits. 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 First degree AV block   3. Palpitations   4. Bradycardia    PLAN:    In order of problems listed above:  Stable.  Follows with Dr. Lovena Le.  Conservative management.. Did not discuss No symptomatic issues.  Has not needed to use metoprolol for tachycardia. No symptomatic episodes of bradycardia or near syncope.   Follow-up with EP.  Continue pill in pocket for episodes of SVT  if they occur.   Medication Adjustments/Labs and Tests Ordered: Current medicines are reviewed at length with the patient today.  Concerns regarding medicines are outlined above.  No orders of the defined types were placed in this encounter.  No orders of the defined types were placed in this encounter.   There are no Patient Instructions on file for this visit.   Signed, Sinclair Grooms, MD  06/19/2022 4:11 PM    [facility]

## 2022-07-31 ENCOUNTER — Ambulatory Visit: Admitting: Podiatry

## 2022-08-03 ENCOUNTER — Ambulatory Visit: Admitting: Podiatry

## 2022-08-04 ENCOUNTER — Other Ambulatory Visit: Payer: Self-pay | Admitting: Hematology and Oncology

## 2022-08-04 ENCOUNTER — Encounter: Payer: Self-pay | Admitting: Hematology and Oncology

## 2022-08-04 MED ORDER — ALPRAZOLAM 0.5 MG PO TABS: 0.5000 mg | ORAL_TABLET | ORAL | 0 refills | Status: DC | PRN

## 2022-08-04 NOTE — Progress Notes (Signed)
Sent a prescription for 2 tablets of Xanax to be taken prior to breast MRI

## 2022-08-11 ENCOUNTER — Ambulatory Visit
Admission: RE | Admit: 2022-08-11 | Discharge: 2022-08-11 | Disposition: A | Discharge status: Home / Self Care | Source: Ambulatory Visit | Attending: Hematology and Oncology | Admitting: Hematology and Oncology

## 2022-08-11 DIAGNOSIS — Z170 Estrogen receptor positive status [ER+]: Secondary | ICD-10-CM

## 2022-08-11 MED ORDER — GADOPICLENOL 0.5 MMOL/ML IV SOLN
5.0000 mL | Freq: Once | INTRAVENOUS | Status: AC | PRN
  Administered 2022-08-11: 5 mL via INTRAVENOUS

## 2022-08-19 NOTE — Progress Notes (Signed)
Patient Care Team: Patient, No Pcp Per as PCP - General (General Practice) Kara Kussmaul, MD as Consulting Physician (General Surgery) Magrinat, Virgie Dad, MD (Inactive) as Consulting Physician (Oncology) [name], MD as Consulting Physician (Radiation Oncology) [name], MD as Consulting Physician (Gastroenterology) [name], MD as Consulting Physician (Dermatology) [name], MD (Family Medicine) [name], MD as Consulting Physician (Obstetrics and Gynecology) [name], RN as Oncology Nurse Navigator [name], RN as Oncology Nurse Navigator Dillingham, Loel Lofty, DO as Attending Physician (Plastic Surgery) Thornton Park, MD as Consulting Physician (Gastroenterology)  DIAGNOSIS: No diagnosis found.  SUMMARY OF ONCOLOGIC HISTORY: Oncology History  Malignant neoplasm of upper-outer quadrant of right breast in female, estrogen receptor positive ([place])  12/26/2019 Cancer Staging   Staging form: Breast, AJCC 8th Edition - Clinical stage from 12/26/2019: Stage IA (cT1b, cN0, cM0, G2, ER+, PR+, HER2-) - Signed by Gardenia Phlegm, NP on 01/07/2020   01/07/2020 Initial Diagnosis   Malignant neoplasm of upper-outer quadrant of right breast in female, estrogen receptor positive ([place])     CHIEF COMPLIANT: Follow-up estrogen receptor positive breast cancer/ Establish oncology care with Dr. Lindi Adie   INTERVAL HISTORY: Tehya Leath Jolley is a 61 year-old female with the above-mentioned estrogen receptor positive. Currently on tamoxifen. She presents to the clinic for a follow-up. Establish oncology care with Dr. Lindi Adie    ALLERGIES:  is allergic to eggs or egg-derived products, elemental sulfur, milk (cow), sulfamethoxazole, and wheat bran.  MEDICATIONS:  Current Outpatient Medications  Medication Sig Dispense Refill   ALPRAZolam (XANAX) 0.5 MG tablet Take 1 tablet (0.5 mg total) by mouth as needed for anxiety. 2 tablet 0    Cholecalciferol (VITAMIN D-3) 125 MCG (5000 UT) TABS Take 5,000 Units by mouth daily.      fexofenadine (ALLEGRA) 180 MG tablet Take 180 mg by mouth daily.     fluticasone (FLONASE) 50 MCG/ACT nasal spray Place 2 sprays into both nostrils daily.     metoprolol tartrate (LOPRESSOR) 25 MG tablet Take one tablet by mouth daily as needed for palpitations 90 tablet 3   Multiple Vitamin (MULTIVITAMIN PO) Take by mouth daily.     Omega-3 Fatty Acids (FISH OIL PO) Take 2,000 mg by mouth daily.     OVER THE COUNTER MEDICATION Take 1 tablet by mouth daily. Pt takes Berberine 1 tablet daily.     polyethylene glycol (MIRALAX / GLYCOLAX) 17 g packet Take 17 g by mouth daily.     Probiotic Product (PROBIOTIC DAILY PO) Take 1 tablet by mouth daily.     senna (SENOKOT) 8.6 MG tablet Take 1 tablet by mouth as needed for constipation.     tamoxifen (NOLVADEX) 20 MG tablet Take 1 tablet (20 mg total) by mouth daily. 90 tablet 4   thyroid (ARMOUR) 60 MG tablet Take 60 mg by mouth daily before breakfast.     triamcinolone cream (KENALOG) 0.1 % as needed.     vitamin k 100 MCG tablet Take 100 mcg by mouth daily.     No current facility-administered medications for this visit.    PHYSICAL EXAMINATION: ECOG PERFORMANCE STATUS: {CHL ONC ECOG [phone]}  There were no vitals filed for this visit. There were no vitals filed for this visit.  BREAST:*** No palpable masses or nodules in either right or left breasts. No palpable axillary supraclavicular or infraclavicular adenopathy no breast tenderness or nipple discharge. (exam performed in the presence of a chaperone)  LABORATORY DATA:  I have reviewed the data as listed    Latest Ref Rng & Units 02/12/2020   12:00 PM 01/07/2020    3:35 PM  CMP  Glucose 70 - 99 mg/dL 83  159   BUN 6 - 20 mg/dL 21  13   Creatinine 0.44 - 1.00 mg/dL 0.91  1.04   Sodium 135 - 145 mmol/L 140  144   Potassium 3.5 - 5.1 mmol/L 4.1  4.2   Chloride 98 - 111 mmol/L 102  106    CO2 22 - 32 mmol/L 29  28   Calcium 8.9 - 10.3 mg/dL 9.5  8.8   Total Protein 6.5 - 8.1 g/dL  6.4   Total Bilirubin 0.3 - 1.2 mg/dL  0.6   Alkaline Phos 38 - 126 U/L  62   AST 15 - 41 U/L  23   ALT 0 - 44 U/L  16     Lab Results  Component Value Date   WBC 4.5 02/12/2020   HGB 14.3 02/12/2020   HCT 43.4 02/12/2020   MCV 99.3 02/12/2020   PLT 231 02/12/2020   NEUTROABS 2.4 01/07/2020    ASSESSMENT & PLAN:  No problem-specific Assessment & Plan notes found for this encounter.    No orders of the defined types were placed in this encounter.  The patient has a good understanding of the overall plan. she agrees with it. she will call with any problems that may develop before the next visit here. Total time spent: 30 mins including face to face time and time spent for planning, charting and co-ordination of care   Suzzette Righter, [place] 08/19/22    I Gardiner Coins am scribing for Dr. Lindi Adie  ***

## 2022-08-22 ENCOUNTER — Other Ambulatory Visit: Payer: Self-pay | Admitting: *Deleted

## 2022-08-22 DIAGNOSIS — Z170 Estrogen receptor positive status [ER+]: Secondary | ICD-10-CM

## 2022-08-23 ENCOUNTER — Encounter: Payer: Self-pay | Admitting: Hematology and Oncology

## 2022-08-23 ENCOUNTER — Inpatient Hospital Stay (HOSPITAL_BASED_OUTPATIENT_CLINIC_OR_DEPARTMENT_OTHER): Admitting: Hematology and Oncology

## 2022-08-23 ENCOUNTER — Inpatient Hospital Stay: Attending: Hematology and Oncology

## 2022-08-23 ENCOUNTER — Other Ambulatory Visit: Payer: Self-pay | Admitting: Hematology and Oncology

## 2022-08-23 VITALS — BP 143/83 | HR 61 | Temp 97.8°F | Resp 18 | Ht 63.0 in

## 2022-08-23 DIAGNOSIS — R5383 Other fatigue: Secondary | ICD-10-CM | POA: Diagnosis not present

## 2022-08-23 DIAGNOSIS — Z170 Estrogen receptor positive status [ER+]: Secondary | ICD-10-CM

## 2022-08-23 DIAGNOSIS — Z853 Personal history of malignant neoplasm of breast: Secondary | ICD-10-CM | POA: Insufficient documentation

## 2022-08-23 DIAGNOSIS — C50411 Malignant neoplasm of upper-outer quadrant of right female breast: Secondary | ICD-10-CM | POA: Diagnosis not present

## 2022-08-23 DIAGNOSIS — Z1231 Encounter for screening mammogram for malignant neoplasm of breast: Secondary | ICD-10-CM

## 2022-08-23 DIAGNOSIS — R928 Other abnormal and inconclusive findings on diagnostic imaging of breast: Secondary | ICD-10-CM

## 2022-08-23 DIAGNOSIS — K5900 Constipation, unspecified: Secondary | ICD-10-CM | POA: Diagnosis not present

## 2022-08-23 DIAGNOSIS — Z79810 Long term (current) use of selective estrogen receptor modulators (SERMs): Secondary | ICD-10-CM | POA: Insufficient documentation

## 2022-08-23 LAB — CMP (CANCER CENTER ONLY)
ALT: 17 U/L (ref 0–44)
AST: 23 U/L (ref 15–41)
Albumin: 4.1 g/dL (ref 3.5–5.0)
Alkaline Phosphatase: 53 U/L (ref 38–126)
Anion gap: 6 (ref 5–15)
BUN: 18 mg/dL (ref 8–23)
CO2: 28 mmol/L (ref 22–32)
Calcium: 8.9 mg/dL (ref 8.9–10.3)
Chloride: 107 mmol/L (ref 98–111)
Creatinine: 0.97 mg/dL (ref 0.44–1.00)
GFR, Estimated: >60 mL/min (ref >60)
Glucose, Bld: 90 mg/dL (ref 70–99)
Potassium: 3.8 mmol/L (ref 3.5–5.1)
Sodium: 141 mmol/L (ref 135–145)
Total Bilirubin: 0.8 mg/dL (ref 0.3–1.2)
Total Protein: 6.7 g/dL (ref 6.5–8.1)

## 2022-08-23 LAB — CBC WITH DIFFERENTIAL (CANCER CENTER ONLY)
Abs Immature Granulocytes: 0 10*3/uL (ref 0.00–0.07)
Basophils Absolute: 0 10*3/uL (ref 0.0–0.1)
Basophils Relative: 0 %
Eosinophils Absolute: 0.1 10*3/uL (ref 0.0–0.5)
Eosinophils Relative: 2 %
HCT: 39.3 % (ref 36.0–46.0)
Hemoglobin: 13.5 g/dL (ref 12.0–15.0)
Immature Granulocytes: 0 %
Lymphocytes Relative: 30 %
Lymphs Abs: 1.1 10*3/uL (ref 0.7–4.0)
MCH: 32.8 pg (ref 26.0–34.0)
MCHC: 34.4 g/dL (ref 30.0–36.0)
MCV: 95.6 fL (ref 80.0–100.0)
Monocytes Absolute: 0.2 10*3/uL (ref 0.1–1.0)
Monocytes Relative: 6 %
Neutro Abs: 2.2 10*3/uL (ref 1.7–7.7)
Neutrophils Relative %: 62 %
Platelet Count: 182 10*3/uL (ref 150–400)
RBC: 4.11 MIL/uL (ref 3.87–5.11)
RDW: 12 % (ref 11.5–15.5)
WBC Count: 3.6 10*3/uL — ABNORMAL LOW (ref 4.0–10.5)
nRBC: 0 % (ref 0.0–0.2)

## 2022-08-23 MED ORDER — TAMOXIFEN CITRATE 20 MG PO TABS: 20.0000 mg | ORAL_TABLET | Freq: Every day | ORAL | 4 refills | Status: DC

## 2022-08-23 NOTE — Assessment & Plan Note (Addendum)
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  Injury to her right foot from falling off a ladder.  She is going to urgent care to get it checked out.  Breast cancer surveillance: Breast MRI 08/11/2022: Benign breast density category D Mammograms April 2023: Benign  Recommended annual MRIs for surveillance given high breast density.  Return to clinic in 1 year for follow-up

## 2022-08-30 ENCOUNTER — Encounter: Payer: Self-pay | Admitting: Hematology and Oncology

## 2022-09-19 ENCOUNTER — Ambulatory Visit (INDEPENDENT_AMBULATORY_CARE_PROVIDER_SITE_OTHER): Payer: Self-pay | Admitting: Plastic Surgery

## 2022-09-19 ENCOUNTER — Encounter: Payer: Self-pay | Admitting: Plastic Surgery

## 2022-09-19 VITALS — BP 124/82 | HR 65

## 2022-09-19 DIAGNOSIS — Z719 Counseling, unspecified: Secondary | ICD-10-CM

## 2022-09-19 MED ORDER — LIDOCAINE 23% - TETRACAINE 7% TOPICAL OINTMENT (PLASTICIZED): 1.0000 | TOPICAL_OINTMENT | Freq: Once | CUTANEOUS | 0 refills | Status: AC

## 2022-09-19 NOTE — Progress Notes (Signed)
The patient is a 60 year old female here for evaluation of her face.  We discussed the halo.  She has had it before and is trying to get her settings for me.  She was very pleased with the results and would like to do it again.  She is scheduled to have it done in a week and a half.  I will send the lidocaine and for her to the [facility] outpatient pharmacy.

## 2022-09-29 ENCOUNTER — Other Ambulatory Visit: Admitting: Plastic Surgery

## 2022-10-03 ENCOUNTER — Encounter: Payer: Self-pay | Admitting: Surgical

## 2022-10-03 NOTE — Progress Notes (Signed)
Received notification pt required compounding pharmacy for lidocaine/tetracaine numbing cream. Patient's procedure was cancelled and rescheduled to 12/2022. Did not sent lidocaine given the 3 month gap. If patient calls to inquire, please route to clinical team at that time

## 2022-10-24 ENCOUNTER — Encounter: Payer: Self-pay | Admitting: Hematology and Oncology

## 2022-10-30 ENCOUNTER — Inpatient Hospital Stay

## 2022-10-30 ENCOUNTER — Inpatient Hospital Stay: Attending: Hematology and Oncology | Admitting: Licensed Clinical Social Worker

## 2022-10-30 ENCOUNTER — Encounter: Payer: Self-pay | Admitting: Licensed Clinical Social Worker

## 2022-10-30 DIAGNOSIS — C50411 Malignant neoplasm of upper-outer quadrant of right female breast: Secondary | ICD-10-CM

## 2022-10-30 DIAGNOSIS — Z803 Family history of malignant neoplasm of breast: Secondary | ICD-10-CM

## 2022-10-30 DIAGNOSIS — Z170 Estrogen receptor positive status [ER+]: Secondary | ICD-10-CM | POA: Diagnosis not present

## 2022-10-30 DIAGNOSIS — Z801 Family history of malignant neoplasm of trachea, bronchus and lung: Secondary | ICD-10-CM

## 2022-10-30 NOTE — Progress Notes (Signed)
REFERRING PROVIDER: Nicholas Lose, MD [place],  [place] [address]   PRIMARY PROVIDER:  Patient, No Pcp Per  PRIMARY REASON FOR VISIT:  1. Malignant neoplasm of upper-outer quadrant of right breast in female, estrogen receptor positive ([place])   2. Family history of breast cancer   3. Family history of lung cancer      HISTORY OF PRESENT ILLNESS:   Kara Kim, a 62 y.o. female, was seen for a [facility] cancer genetics consultation at the request of Dr. Lindi Adie due to a personal and family history of breast cancer.  Kara Kim presents to clinic today to discuss the possibility of a hereditary predisposition to cancer, genetic testing, and to further clarify her future cancer risks, as well as potential cancer risks for family members.   CANCER HISTORY:  Oncology History  Malignant neoplasm of upper-outer quadrant of right breast in female, estrogen receptor positive ([facility])  12/26/2019 Cancer Staging   Staging form: Breast, AJCC 8th Edition - Clinical stage from 12/26/2019: Stage IA (cT1b, cN0, cM0, G2, ER+, PR+, HER2-) - Signed by Gardenia Phlegm, NP on 01/07/2020   01/07/2020 Initial Diagnosis   Malignant neoplasm of upper-outer quadrant of right breast in female, estrogen receptor positive ([place])    In 2021, at the age of 70, Kara Kim was diagnosed with invasive ductal carcinoma of the right breast, ER/PR+ HER2-. The treatment plan included lumpectomy, adjuvant radiation, and tamoxifen.   RISK FACTORS:  Menarche was at age 28.  First live birth at age 70.  Ovaries intact: yes.  Hysterectomy: no.  Menopausal status: postmenopausal.  HRT use: 10 years Colonoscopy: yes;  polyps on most recent colonoscopy .  Past Medical History:  Diagnosis Date   Anemia    Breast cancer ([facility])    right   Complication of anesthesia    Family history of adverse reaction to anesthesia    mother with history N/V   Heart murmur    then another MD said no murmur   Mitral  valve disorders(424.0) 07/29/2013   Osteopenia    PONV (postoperative nausea and vomiting)    Seasonal allergies    Thyroid disease     Past Surgical History:  Procedure Laterality Date   ANAL FISSURE REPAIR     BREAST BIOPSY Right 12/2019   BREAST BIOPSY Right 01/2020   BREAST LUMPECTOMY Right 01/2020   BREAST LUMPECTOMY WITH RADIOACTIVE SEED AND SENTINEL LYMPH NODE BIOPSY Right 02/19/2020   Procedure: RIGHT BREAST LUMPECTOMY X 2  WITH RADIOACTIVE SEED AND SENTINEL LYMPH NODE BIOPSY;  Surgeon: [name], MD;  Location: [facility];  Service: General;  Laterality: Right;   CESAREAN SECTION     COLONOSCOPY     eye lid lift     MASTOPEXY Left 02/09/2021   Procedure: Left breast mastopexy for symmetry;  Surgeon: Wallace Going, DO;  Location: [facility];  Service: Plastics;  Laterality: Left;   tummy tuck      FAMILY HISTORY:  We obtained a detailed, 4-generation family history.  Significant diagnoses are listed below: Family History  Problem Relation Age of Onset   Lung cancer Mother 56   Hypertension Father    Hypertension Sister    Breast cancer Sister        bilateral; dx 27; neg [employer] Mult-Cancer+RNA   Cancer Paternal Grandmother        unk type d. 47s   Lung cancer Paternal Grandfather  d.>50   Seizures Son    Colon cancer Neg Hx    Esophageal cancer Neg Hx    Rectal cancer Neg Hx    Stomach cancer Neg Hx    Kara Kim has 2 sons, 18 and 15. She has 2 sisters, one is 47 and one is 7. Her older sister was recently diagnosed with bilateral breast cancer and underwent genetic testing that was negative (patient had copy of this - [employer] Multi-Cancer+RNA panel- 84 genes).   Kara Kim mother had lung cancer and died at 63. Maternal grandmother died at 86, grandfather died ar 71 due to heart issues.  Kara Kim father died at 11 of an aneurysm. Paternal grandmother had cancer, unknown type, she had her leg amputated because of it. She passed in  her 59s. Grandfather had lung cancer and passed over 23, he worked in [employer].  Ms. Signer is aware of previous family history of genetic testing for hereditary cancer risks. There is no reported Ashkenazi Jewish ancestry. There is no known consanguinity.    GENETIC COUNSELING ASSESSMENT: Kara Kim is a 62 y.o. female with a personal and family history which is somewhat suggestive of a hereditary cancer syndrome and predisposition to cancer. We, therefore, discussed and recommended the following at today's visit.   DISCUSSION: We discussed that approximately 10% of breast cancer is hereditary. Most cases of hereditary breast cancer are associated with BRCA1/BRCA2 genes, although there are other genes associated with hereditary cancer as well. Cancers and risks are gene specific. It is unlikely she has a hereditary cause for her cancer since her sister's genetic testing was negative, however we cannot rule out the possibility that Kara Kim does have a hereditary cause for her cancer that her sister did not inherit. She meets criteria on her own given that she was 69 when she was diagnosed. We discussed that testing is beneficial for several reasons including knowing about cancer risks, identifying potential screening and risk-reduction options that may be appropriate, and to understand if other family members could be at risk for cancer and allow them to undergo genetic testing.   We reviewed the characteristics, features and inheritance patterns of hereditary cancer syndromes. We also discussed genetic testing, including the appropriate family members to test, the process of testing, insurance coverage and turn-around-time for results. We discussed the implications of a negative, positive and/or variant of uncertain significant result. We recommended Kara Kim pursue genetic testing for the [employer] Multi-Cancer+RNA gene panel.   Based on Kara Kim's personal and family history of cancer, she meets  medical criteria for genetic testing. Despite that she meets criteria, she may still have an out of pocket cost. We discussed that if her out of pocket cost for testing is over $100, the laboratory will call and confirm whether she wants to proceed with testing.  If the out of pocket cost of testing is less than $100 she will be billed by the genetic testing laboratory.   PLAN: After considering the risks, benefits, and limitations,Kara Kim did not wish to pursue genetic testing at today's visit. She would like to call her insurance/[employer] to find out how much testing will specifically cost for her. We understand this decision and remain available to coordinate genetic testing at any time in the future. We, therefore, recommend Kara Kim continue to follow the cancer screening guidelines given by her primary healthcare provider.  Kara Kim questions were answered to her satisfaction today. Our contact information was provided should additional  questions or concerns arise. Thank you for the referral and allowing [place] to share in the care of your patient.   Faith Rogue, MS, [facility] Genetic Counselor [email or web address] Phone: [phone]  The patient was seen for a total of 30 minutes in face-to-face genetic counseling.  Dr. Grayland Ormond was available for discussion regarding this case.   _______________________________________________________________________ For Office Staff:  Number of people involved in session: 1 Was an Intern/ student involved with case: no

## 2022-11-13 ENCOUNTER — Ambulatory Visit

## 2022-11-20 DIAGNOSIS — Z719 Counseling, unspecified: Secondary | ICD-10-CM

## 2022-11-21 ENCOUNTER — Telehealth: Payer: Self-pay | Admitting: Surgical

## 2022-11-29 DIAGNOSIS — Z719 Counseling, unspecified: Secondary | ICD-10-CM

## 2022-12-04 ENCOUNTER — Encounter

## 2022-12-11 ENCOUNTER — Encounter: Payer: Self-pay | Admitting: Plastic Surgery

## 2022-12-11 ENCOUNTER — Encounter

## 2022-12-18 ENCOUNTER — Encounter

## 2022-12-22 ENCOUNTER — Other Ambulatory Visit: Admitting: Plastic Surgery

## 2023-01-16 ENCOUNTER — Other Ambulatory Visit: Admitting: Plastic Surgery

## 2023-02-07 ENCOUNTER — Other Ambulatory Visit: Payer: Self-pay | Admitting: Hematology and Oncology

## 2023-02-07 DIAGNOSIS — Z853 Personal history of malignant neoplasm of breast: Secondary | ICD-10-CM

## 2023-02-09 ENCOUNTER — Ambulatory Visit
Admission: RE | Admit: 2023-02-09 | Discharge: 2023-02-09 | Disposition: A | Discharge status: Home / Self Care | Source: Ambulatory Visit | Attending: Hematology and Oncology | Admitting: Hematology and Oncology

## 2023-02-09 DIAGNOSIS — Z853 Personal history of malignant neoplasm of breast: Secondary | ICD-10-CM

## 2023-02-15 ENCOUNTER — Ambulatory Visit: Admitting: Internal Medicine

## 2023-03-06 ENCOUNTER — Ambulatory Visit: Attending: Internal Medicine | Admitting: Internal Medicine

## 2023-03-06 ENCOUNTER — Encounter: Payer: Self-pay | Admitting: Internal Medicine

## 2023-03-06 VITALS — BP 112/70 | HR 58 | Ht 63.0 in | Wt 124.0 lb

## 2023-03-06 DIAGNOSIS — R002 Palpitations: Secondary | ICD-10-CM

## 2023-03-06 DIAGNOSIS — I491 Atrial premature depolarization: Secondary | ICD-10-CM

## 2023-03-06 DIAGNOSIS — I493 Ventricular premature depolarization: Secondary | ICD-10-CM | POA: Diagnosis not present

## 2023-03-06 NOTE — Patient Instructions (Signed)

## 2023-03-06 NOTE — Progress Notes (Unsigned)
HPI Kara Kim is referred by Dr. Katrinka Blazing for evaluation of palpitations. She has a remote h/o MVP and an echo in 2014 did not substantiate this diagnosis. She has a h/o breast CA and noted worsening palpitations after treatment. She notes that her apple watch demonstrates bradycardia when she sleeps and she wore a cardiac monitor which demonstrated early morning pauses (sleeping) as well as NS SVT and PAC's and PVC's. She notes a remote h/o syncope which is thought to be vagally mediated and controlled.  Allergies  Allergen Reactions   Egg-Derived Products Swelling   Elemental Sulfur Other (See Comments)    Levonne Spiller Syndrome   Milk (Cow)     Other reaction(s): Abdominal Pain   Sulfamethoxazole Rash   Wheat     Other reaction(s): Abdominal Pain     Current Outpatient Medications  Medication Sig Dispense Refill   ALPRAZolam (XANAX) 0.5 MG tablet Take 1 tablet (0.5 mg total) by mouth as needed for anxiety. 2 tablet 0   Cholecalciferol (VITAMIN D-3) 125 MCG (5000 UT) TABS Take 5,000 Units by mouth daily.      fexofenadine (ALLEGRA) 180 MG tablet Take 180 mg by mouth daily.     fluticasone (FLONASE) 50 MCG/ACT nasal spray Place 2 sprays into both nostrils daily.     metoprolol tartrate (LOPRESSOR) 25 MG tablet Take one tablet by mouth daily as needed for palpitations 90 tablet 3   Multiple Vitamin (MULTIVITAMIN PO) Take by mouth daily.     Omega-3 Fatty Acids (FISH OIL PO) Take 2,000 mg by mouth daily.     OVER THE COUNTER MEDICATION Take 1 tablet by mouth daily. Pt takes Berberine 1 tablet daily.     polyethylene glycol (MIRALAX / GLYCOLAX) 17 g packet Take 17 g by mouth daily.     Probiotic Product (PROBIOTIC DAILY PO) Take 1 tablet by mouth daily.     senna (SENOKOT) 8.6 MG tablet Take 1 tablet by mouth as needed for constipation.     tamoxifen (NOLVADEX) 20 MG tablet Take 1 tablet (20 mg total) by mouth daily. 90 tablet 4   thyroid (ARMOUR) 60 MG tablet Take 60 mg by mouth  daily before breakfast.     vitamin k 100 MCG tablet Take 100 mcg by mouth daily.     triamcinolone cream (KENALOG) 0.1 % as needed.     No current facility-administered medications for this visit.     Past Medical History:  Diagnosis Date   Anemia    Breast cancer ([facility])    right   Complication of anesthesia    Family history of adverse reaction to anesthesia    mother with history N/V   Heart murmur    then another MD said no murmur   Mitral valve disorders(424.0) 07/29/2013   Osteopenia    PONV (postoperative nausea and vomiting)    Seasonal allergies    Thyroid disease     ROS:   All systems reviewed and negative except as noted in the HPI.   Past Surgical History:  Procedure Laterality Date   ANAL FISSURE REPAIR     BREAST BIOPSY Right 12/2019   BREAST BIOPSY Right 01/2020   BREAST LUMPECTOMY Right 01/2020   BREAST LUMPECTOMY WITH RADIOACTIVE SEED AND SENTINEL LYMPH NODE BIOPSY Right 02/19/2020   Procedure: RIGHT BREAST LUMPECTOMY X 2  WITH RADIOACTIVE SEED AND SENTINEL LYMPH NODE BIOPSY;  Surgeon: Griselda Miner, MD;  Location: [facility];  Service: General;  Laterality: Right;   CESAREAN SECTION     COLONOSCOPY     eye lid lift     MASTOPEXY Left 02/09/2021   Procedure: Left breast mastopexy for symmetry;  Surgeon: Peggye Form, DO;  Location: [facility];  Service: Plastics;  Laterality: Left;   tummy tuck       Family History  Problem Relation Age of Onset   Lung cancer Mother 64   Hypertension Father    Hypertension Sister    Breast cancer Sister        bilateral; dx 41; neg [employer] Mult-Cancer+RNA   Cancer Paternal Grandmother        unk type d. 83s   Lung cancer Paternal Grandfather        d.>50   Seizures Son    Colon cancer Neg Hx    Esophageal cancer Neg Hx    Rectal cancer Neg Hx    Stomach cancer Neg Hx      Social History   Socioeconomic History   Marital status: Married    Spouse name: Not on file   Number of  children: Not on file   Years of education: Not on file   Highest education level: Not on file  Occupational History   Not on file  Tobacco Use   Smoking status: Never   Smokeless tobacco: Never  Vaping Use   Vaping Use: Never used  Substance and Sexual Activity   Alcohol use: Yes    Alcohol/week: 4.0 standard drinks of alcohol    Types: 4 Glasses of wine per week    Comment: occasional   Drug use: No   Sexual activity: Not on file  Other Topics Concern   Not on file  Social History Narrative   Not on file   Social Determinants of Health   Financial Resource Strain: Not on file  Food Insecurity: Not on file  Transportation Needs: Not on file  Physical Activity: Not on file  Stress: Not on file  Social Connections: Not on file  Intimate Partner Violence: Not on file     BP 112/70   Pulse (!) 58   Ht 5\' 3"  (1.6 m)   Wt 124 lb (56.2 kg)   SpO2 98%   BMI 21.97 kg/m   Physical Exam:  Well appearing NAD HEENT: Unremarkable Neck:  No JVD, no thyromegally Lymphatics:  No adenopathy Back:  No CVA tenderness Lungs:  Clear HEART:  Regular rate rhythm, no murmurs, no rubs, no clicks Abd:  soft, positive bowel sounds, no organomegally, no rebound, no guarding Ext:  2 plus pulses, no edema, no cyanosis, no clubbing Skin:  No rashes no nodules Neuro:  CN II through XII intact, motor grossly intact  EKG  DEVICE  Normal device function.  See PaceArt for details.   Assess/Plan:Palpitations -I have reviewed the etiology and discussed the benign nature of her symptoms. I would not recommend any change in her treatment unless her symptoms worsen. I encouraged her to avoid excess ETOH/caffeine and get adequate sleep.  Syncope - Her symptoms are resolved/well controlled.  PAC's/PVC's - I discussed the benign nature of the problem and we will avoid AA drug therapy for now.   Kara Gowda Leylah Tarnow,MD

## 2023-04-16 ENCOUNTER — Encounter: Payer: Self-pay | Admitting: Internal Medicine

## 2023-04-16 MED ORDER — METOPROLOL TARTRATE 25 MG PO TABS: ORAL_TABLET | ORAL | 3 refills | Status: DC

## 2023-05-14 ENCOUNTER — Encounter

## 2023-06-29 ENCOUNTER — Encounter: Payer: Self-pay | Admitting: Internal Medicine

## 2023-07-16 ENCOUNTER — Encounter: Payer: Self-pay | Admitting: Internal Medicine

## 2023-08-07 ENCOUNTER — Other Ambulatory Visit: Payer: Self-pay | Admitting: Hematology and Oncology

## 2023-08-07 ENCOUNTER — Encounter: Payer: Self-pay | Admitting: Hematology and Oncology

## 2023-08-07 MED ORDER — ALPRAZOLAM 0.5 MG PO TABS: 0.5000 mg | ORAL_TABLET | ORAL | 0 refills | Status: DC | PRN

## 2023-08-07 NOTE — Telephone Encounter (Signed)
Can you please send.  Its for high anxiety related to her breast MRI.  IT is only for 2 tablet. Thanks!

## 2023-08-13 ENCOUNTER — Ambulatory Visit
Admission: RE | Admit: 2023-08-13 | Discharge: 2023-08-13 | Disposition: A | Discharge status: Home / Self Care | Source: Ambulatory Visit | Attending: Hematology and Oncology

## 2023-08-13 DIAGNOSIS — Z170 Estrogen receptor positive status [ER+]: Secondary | ICD-10-CM

## 2023-08-13 MED ORDER — GADOPICLENOL 0.5 MMOL/ML IV SOLN
6.0000 mL | Freq: Once | INTRAVENOUS | Status: AC | PRN
  Administered 2023-08-13: 6 mL via INTRAVENOUS

## 2023-08-14 ENCOUNTER — Telehealth: Payer: Self-pay | Admitting: *Deleted

## 2023-08-14 NOTE — Telephone Encounter (Signed)
-----   Message from Noreene Filbert sent at 08/13/2023 12:36 PM EDT ----- Mri breast negative, please call patient with the good news. ----- Message ----- From: Interface, Rad Results In Sent: 08/13/2023  11:57 AM EDT To: Serena Croissant, MD

## 2023-08-14 NOTE — Telephone Encounter (Signed)
RN attempt x1 to contact pt with below information.  No answer, LVM for pt to return call to the office.

## 2023-08-24 ENCOUNTER — Inpatient Hospital Stay: Attending: Hematology and Oncology | Admitting: Hematology and Oncology

## 2023-08-24 ENCOUNTER — Other Ambulatory Visit: Admitting: Plastic Surgery

## 2023-08-24 VITALS — BP 115/73 | HR 68 | Temp 97.7°F | Resp 16 | Ht 63.0 in | Wt 129.3 lb

## 2023-08-24 DIAGNOSIS — Z79810 Long term (current) use of selective estrogen receptor modulators (SERMs): Secondary | ICD-10-CM | POA: Diagnosis not present

## 2023-08-24 DIAGNOSIS — Z1721 Progesterone receptor positive status: Secondary | ICD-10-CM | POA: Diagnosis not present

## 2023-08-24 DIAGNOSIS — Z170 Estrogen receptor positive status [ER+]: Secondary | ICD-10-CM | POA: Diagnosis not present

## 2023-08-24 DIAGNOSIS — Z923 Personal history of irradiation: Secondary | ICD-10-CM | POA: Diagnosis not present

## 2023-08-24 DIAGNOSIS — C50411 Malignant neoplasm of upper-outer quadrant of right female breast: Secondary | ICD-10-CM | POA: Insufficient documentation

## 2023-08-24 DIAGNOSIS — K5909 Other constipation: Secondary | ICD-10-CM | POA: Insufficient documentation

## 2023-08-24 NOTE — Progress Notes (Signed)
Patient Care Team: Patient, No Pcp Per as PCP - General (General Practice) Griselda Miner, MD as Consulting Physician (General Surgery) Magrinat, Valentino Hue, MD (Inactive) as Consulting Physician (Oncology) Dorothy Puffer, MD as Consulting Physician (Radiation Oncology) Willis Modena, MD as Consulting Physician (Gastroenterology) Elmon Else, MD as Consulting Physician (Dermatology) Jonnie Kind, MD (Family Medicine) Zelphia Cairo, MD as Consulting Physician (Obstetrics and Gynecology) Pershing Proud, RN as Oncology Nurse Navigator Donnelly Angelica, RN as Oncology Nurse Navigator Dillingham, Alena Bills, DO as Attending Physician (Plastic Surgery) Tressia Danas, MD (Inactive) as Consulting Physician (Gastroenterology)  DIAGNOSIS:  Encounter Diagnosis  Name Primary?   Malignant neoplasm of upper-outer quadrant of right breast in female, estrogen receptor positive (HCC) Yes    SUMMARY OF ONCOLOGIC HISTORY: Oncology History  Malignant neoplasm of upper-outer quadrant of right breast in female, estrogen receptor positive ([facility])  12/26/2019 Cancer Staging   Staging form: Breast, AJCC 8th Edition - Clinical stage from 12/26/2019: Stage IA (cT1b, cN0, cM0, G2, ER+, PR+, HER2-) - Signed by Loa Socks, NP on 01/07/2020   01/07/2020 Initial Diagnosis   Malignant neoplasm of upper-outer quadrant of right breast in female, estrogen receptor positive ([facility])     CHIEF COMPLIANT:   History of Present Illness   The patient, with a history of breast cancer, presents with constipation. She reports a change in bowel habits over the past month, with daily constipation and gas, and only one normal bowel movement per week. She has been managing the constipation with daily Miralax, which was previously used only occasionally. She also reports a change in diet due to working at [employer] twice a year, where she has been consuming gluten, which she does not usually eat.  In  addition to the constipation, the patient reports a bruised foot from hitting a cast in the dark. She also discusses her ongoing tamoxifen treatment for breast cancer, noting that she has been on the medication for about a year. She expresses concerns about the density of her breasts and the need for annual MRIs, as well as the potential risks of tamoxifen, including blood clots and uterine cancer. She also inquires about the potential impact of alcohol on her health and the potential cognitive effects of tamoxifen.      ALLERGIES:  is allergic to egg-derived products, elemental sulfur, milk (cow), sulfamethoxazole, and wheat.  MEDICATIONS:  Current Outpatient Medications  Medication Sig Dispense Refill   ALPRAZolam (XANAX) 0.5 MG tablet Take 1 tablet (0.5 mg total) by mouth as needed for anxiety. 2 tablet 0   Cholecalciferol (VITAMIN D-3) 125 MCG (5000 UT) TABS Take 5,000 Units by mouth daily.      fexofenadine (ALLEGRA) 180 MG tablet Take 180 mg by mouth daily.     fluticasone (FLONASE) 50 MCG/ACT nasal spray Place 2 sprays into both nostrils daily.     metoprolol tartrate (LOPRESSOR) 25 MG tablet Take one tablet by mouth daily as needed for palpitations 25 tablet 3   Multiple Vitamin (MULTIVITAMIN PO) Take by mouth daily.     Omega-3 Fatty Acids (FISH OIL PO) Take 2,000 mg by mouth daily.     OVER THE COUNTER MEDICATION Take 1 tablet by mouth daily. Pt takes Berberine 1 tablet daily.     polyethylene glycol (MIRALAX / GLYCOLAX) 17 g packet Take 17 g by mouth daily.     Probiotic Product (PROBIOTIC DAILY PO) Take 1 tablet by mouth daily.     senna (SENOKOT) 8.6 MG  tablet Take 1 tablet by mouth as needed for constipation.     tamoxifen (NOLVADEX) 20 MG tablet Take 1 tablet (20 mg total) by mouth daily. 90 tablet 4   thyroid (ARMOUR) 60 MG tablet Take 60 mg by mouth daily before breakfast.     vitamin k 100 MCG tablet Take 100 mcg by mouth daily.     triamcinolone cream (KENALOG) 0.1 % as  needed.     No current facility-administered medications for this visit.    PHYSICAL EXAMINATION: ECOG PERFORMANCE STATUS: 1 - Symptomatic but completely ambulatory  Vitals:   08/24/23 1129  BP: 115/73  Pulse: 68  Resp: 16  Temp: 97.7 F (36.5 C)  SpO2: 100%   Filed Weights   08/24/23 1129  Weight: 129 lb 4.8 oz (58.7 kg)    LABORATORY DATA:  I have reviewed the data as listed    Latest Ref Rng & Units 08/23/2022   11:56 AM 02/12/2020   12:00 PM 01/07/2020    3:35 PM  CMP  Glucose 70 - 99 mg/dL 90  83  884   BUN 8 - 23 mg/dL 18  21  13    Creatinine 0.44 - 1.00 mg/dL 1.66  0.63  0.16   Sodium 135 - 145 mmol/L 141  140  144   Potassium 3.5 - 5.1 mmol/L 3.8  4.1  4.2   Chloride 98 - 111 mmol/L 107  102  106   CO2 22 - 32 mmol/L 28  29  28    Calcium 8.9 - 10.3 mg/dL 8.9  9.5  8.8   Total Protein 6.5 - 8.1 g/dL 6.7   6.4   Total Bilirubin 0.3 - 1.2 mg/dL 0.8   0.6   Alkaline Phos 38 - 126 U/L 53   62   AST 15 - 41 U/L 23   23   ALT 0 - 44 U/L 17   16     Lab Results  Component Value Date   WBC 3.6 (L) 08/23/2022   HGB 13.5 08/23/2022   HCT 39.3 08/23/2022   MCV 95.6 08/23/2022   PLT 182 08/23/2022   NEUTROABS 2.2 08/23/2022    ASSESSMENT & PLAN:  Malignant neoplasm of upper-outer quadrant of right breast in female, estrogen receptor positive ([facility]) Dr. Darnelle Catalan patient establishing oncology care with me 12/26/2019: T1BNX stage Ia grade 2 IDC ER/PR positive HER2 negative Ki-67 5% 02/19/2020: Right lumpectomy with sentinel lymph node biopsy: T1 BN 0 stage Ia grade 2 IDC negative margins 0/2 lymph nodes negative, Oncotype DX score 5 04/11/2020: Completed adjuvant radiation   Current treatment: Tamoxifen started 05/23/2020 Tamoxifen toxicities: Intermittent constipation but otherwise tolerating tamoxifen extremely well.   Breast cancer surveillance: Breast MRI 08/13/2023: Benign breast density category D Mammograms 02/09/23: Benign   Recommended annual MRIs for  surveillance given high breast density.  Constipation Chronic issue, worsened over the past month. Currently using Miralax daily with limited relief. -Start daily Senna or similar stool softener. -Use Miralax as needed for additional relief.    Follow-up in 1 year.          Orders Placed This Encounter  Procedures   MR BREAST BILATERAL W WO CONTRAST INC CAD    Standing Status:   Future    Standing Expiration Date:   08/23/2024    Order Specific Question:   If indicated for the ordered procedure, I authorize the administration of contrast media per Radiology protocol    Answer:   Yes  Order Specific Question:   What is the patient's sedation requirement?    Answer:   No Sedation    Order Specific Question:   Does the patient have a pacemaker or implanted devices?    Answer:   No    Order Specific Question:   Preferred imaging location?    Answer:   [facility] (table limit-550lbs)   The patient has a good understanding of the overall plan. she agrees with it. she will call with any problems that may develop before the next visit here. Total time spent: 30 mins including face to face time and time spent for planning, charting and co-ordination of care   Tamsen Meek, MD 08/24/23

## 2023-08-24 NOTE — Assessment & Plan Note (Addendum)
Dr. Darnelle Catalan patient establishing oncology care with me 12/26/2019: T1BNX stage Ia grade 2 IDC ER/PR positive HER2 negative Ki-67 5% 02/19/2020: Right lumpectomy with sentinel lymph node biopsy: T1 BN 0 stage Ia grade 2 IDC negative margins 0/2 lymph nodes negative, Oncotype DX score 5 04/11/2020: Completed adjuvant radiation   Current treatment: Tamoxifen started 05/23/2020 Tamoxifen toxicities: Intermittent constipation but otherwise tolerating tamoxifen extremely well.   Breast cancer surveillance: Breast MRI 08/13/2023: Benign breast density category D Mammograms 02/09/23: Benign   Recommended annual MRIs for surveillance given high breast density.   Return to clinic in 1 year for follow-up

## 2023-08-29 ENCOUNTER — Other Ambulatory Visit: Payer: Self-pay | Admitting: Hematology and Oncology

## 2023-09-04 ENCOUNTER — Telehealth: Payer: Self-pay

## 2023-09-04 NOTE — Telephone Encounter (Signed)
   Pre-operative Risk Assessment    Patient Name: Kara Kim  DOB: 10/30/1960 MRN: [id number]   DATE OF LAST OFFICE VISIT: 03/06/23 WITH DR. Ladona Ridgel DATE OF NEXT OFFICE VISIT: NONE   Request for Surgical Clearance    Procedure:   RIGHT SHOULDER ARTHROSCOPY WITH RCR  Date of Surgery:  Clearance 10/02/23                                 Surgeon:  DR. Caryn Bee SUPPLE Surgeon's Group or Practice Name:  [facility]  Phone number:  [phone] Fax number:  [phone] ATTN: Tresa Endo [facility]   Type of Clearance Requested:   - Medical -NONE INDICATED TO BE HELD   Type of Anesthesia:  General    Additional requests/questions:    Robley Fries   09/04/2023, 12:40 PM

## 2023-09-04 NOTE — Telephone Encounter (Signed)
   Name: Kara Kim  DOB: 02-Jul-1961  MRN: [id number]  Primary Cardiologist: None  Chart reviewed as part of pre-operative protocol coverage. Because of Birgit Bowlan Milbrath's past medical history and time since last visit, she will require a follow-up in-office visit in order to better assess Last seen by Dr. Ladona Ridgel on 03/06/2023 but he does not give pre-op clearances and has not responded to requests for clearance this last month. Last seen by Dr. Verdis Prime 06/20/2023, preoperative cardiovascular risk.  Pre-op covering staff: - Please schedule appointment and call patient to inform them. If patient already had an upcoming appointment within acceptable timeframe, please add "pre-op clearance" to the appointment notes so provider is aware. - Please contact requesting surgeon's office via preferred method (i.e, phone, fax) to inform them of need for appointment prior to surgery.  appointment.   Joni Reining, NP  09/04/2023, 1:27 PM

## 2023-09-04 NOTE — Telephone Encounter (Signed)
Pt has been scheduled in office appt for pre op clearance 09/10/23 with Adin Hector, PAC at the [facility] location. I will update all parties involved.

## 2023-09-07 ENCOUNTER — Ambulatory Visit (AMBULATORY_SURGERY_CENTER)

## 2023-09-07 VITALS — Ht 63.0 in | Wt 129.0 lb

## 2023-09-07 DIAGNOSIS — Z860100 Personal history of colon polyps, unspecified: Secondary | ICD-10-CM

## 2023-09-07 MED ORDER — NA SULFATE-K SULFATE-MG SULF 17.5-3.13-1.6 GM/177ML PO SOLN
1.0000 | Freq: Once | ORAL | 0 refills | Status: AC
Start: 2023-09-07 — End: 2023-09-07

## 2023-09-07 NOTE — Progress Notes (Signed)
No egg allergy   No soy allergy known to patient  No issues known to pt with past sedation with any surgeries or procedures Patient denies ever being told they had issues or difficulty with intubation  No FH of Malignant Hyperthermia Pt is not on diet pills Pt is not on  home 02  Pt is not on blood thinners  Pt has issues with constipation takes 2 tabs of Senna & miralax everyday No A fib or A flutter Have any cardiac testing pending--no Pt can ambulate independently Pt denies use of chewing tobacco Discussed diabetic I weight loss medication holds Discussed NSAID holds Checked BMI Pt instructed to use Singlecare.com or GoodRx for a price reduction on prep  Patient's chart reviewed by Kara Kim CNRA prior to previsit and patient appropriate for the [facility].  Pre visit completed and red dot placed by patient's name on their procedure day (on provider's schedule).

## 2023-09-09 NOTE — Progress Notes (Unsigned)
Cardiology Office Note:    Date:  09/10/2023   ID:  Drayah Kim, DOB October 16, 1961, MRN [id number]  PCP:  Patient, No Pcp Per  Cardiologist:  Kara Bunting, MD     Referring MD: No ref. provider found   Chief Complaint: pre-op evaluation  History of Present Illness:    Kara Kim is a 62 y.o. female with a history of palpitations with paroxysmal SVT and PACs/ PVCs as well as bradycardia with early morning pauses noted on monitor in 10/2021, hypothyroidism, and breast cancer who is followed by Dr. Ladona Kim and presents today for pre-op evaluation.   Patient is primarily followed by Cardiology for palpitations. She reportedly has a remote history of mitral valve prolapse. However, Echo in 2014 did not show any evidence of this. She also has a remote history of syncope which was felt to be vagally mediated. She noticed worsening palpitations in early 2023 after treatment for breast cancer. She also noticed that her Apple Watch demonstrated bradycardia when sleeping. Monitor in 10/2021 showed underlying sinus rhythm with one 5 beat run of NSVT,  9 short runs of SVT (longest run lasting 7 beats), rare PACs/ PVCs, and bradycardia with early morning pauses (sleeping). She was referred to Dr. 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   She was last seen by Dr. Ladona Kim in 02/2023 at which time she was stable from a cardiac standpoint on PRN Lopressor.   Patient presents today for pre-op evaluation for upcoming shoulder arthroscopy with RCR and colonoscopy, both in 09/2023. She doing very well from a cardiac standpoint. She will occasional have palpitations that sounds like premature beats but no prolonged/ sustained palpitations. She also reports her Apple Watch will indicate she has some mild bradycardia (rates around the 40s) at night/ early morning hours. However, no bradycardia noted during the day. She denies any chest pain, shortness of breath,  orthopnea, PND, edema, lightheadedness, dizziness, or syncope. She is easily able to complete >4.0 METs of physical activity without any anginal symptoms. She walks 5 miles every day without any issues and prior to injuring her shoulder she was going to the gym regularly as well.   EKGs/Labs/Other Studies Reviewed:    The following studies were reviewed:  Monitor 11/16/2021 to 11/30/2021: Patient had a min HR of 37 bpm, max HR of 150 bpm, and avg HR of 59 bpm. Predominant underlying rhythm was Sinus Rhythm. First Degree AV Block was present. 1 run of Ventricular Tachycardia occurred lasting 5 beats with a max rate of 148 bpm (avg 134  bpm). 9 Supraventricular Tachycardia runs occurred, the run with the fastest interval lasting 6 beats with a max rate of 150 bpm, the longest lasting 7 beats with an avg rate of 105 bpm. Supraventricular Tachycardia was detected within +/- 45 seconds of  symptomatic patient event(s). Isolated SVEs were rare (<1.0%), SVE Couplets were rare (<1.0%), and SVE Triplets were rare (<1.0%). Isolated VEs were rare (<1.0%), and no VE Couplets or VE Triplets were present. MD notification criteria for Symptomatic  Bradycardia met - report posted prior to notification per account request (RD).  Normal sinus rhythm Rare PAC and PVC activity noted. Non sustained SVT correlating with one symptomatic episode. One episode 5 beat VT. No atrial fibrillation, pauses, or high grade AVB. Sinus bradycardia at 37 bpm 7:40 AM 11/20/2021   EKG:  EKG ordered today.   EKG Interpretation Date/Time:  Monday September 10 2023 14:00:18 EST Ventricular Rate:  60 PR Interval:  206 QRS Duration:  88 QT Interval:  406 QTC Calculation: 406 R Axis:   56  Text Interpretation: Normal sinus rhythm Normal ECG When compared with ECG of 12-Feb-2020 11:37, No significant change was found Confirmed by Marjie Skiff [phone]) on 09/10/2023 2:15:31 PM    Recent Labs: No results found for requested labs  within last 365 days.  Recent Lipid Panel No results found for: "CHOL", "TRIG", "HDL", "CHOLHDL", "VLDL", "LDLCALC", "LDLDIRECT"  Physical Exam:    Vital Signs: BP 118/70   Pulse 60   Ht 5\' 3"  (1.6 m)   Wt 125 lb 12.8 oz (57.1 kg)   SpO2 98%   BMI 22.28 kg/m     Wt Readings from Last 3 Encounters:  09/10/23 125 lb 12.8 oz (57.1 kg)  09/07/23 129 lb (58.5 kg)  08/24/23 129 lb 4.8 oz (58.7 kg)     General: 62 y.o. thin Caucasian female in no acute distress. HEENT: Normocephalic and atraumatic. Sclera clear.  Neck: Supple. No carotid bruits. No JVD. Heart: RRR. Distinct S1 and S2. No murmurs, gallops, or rubs.  Lungs: No increased work of breathing. Clear to ausculation bilaterally. No wheezes, rhonchi, or rales.  Skin: Warm and dry. Neuro: No focal deficits. Psych: Normal affect. Responds appropriately.   Assessment:    1. Pre-op evaluation   2. Palpitations   3. Tachy-brady syndrome [facility])     Plan:    Pre-Op Evaluation Patient has upcoming shoulder arthroscopy with RCR and colonoscopy, both planned for 09/2023. She is stable from a cardiac standpoint with no angina, shortness of breath, acute CHF symptoms, or syncope. She is able to complete >4.0 METs of physical activity without any anginal symptoms. Per Revised Cardiac Risk Index, considered Class I Risk with 3.9% chance of a major cardiac event perioperatively. Therefore, based on ACC/AHA guidelines, patient would be at acceptable risk for the planned procedure without further cardiovascular testing. I will route this recommendation to the requesting party via Epic fax function. Of note, we have received a clearance form from the Orthopedic office but are still waiting on a clearance form from GI.  Palpitations Tachy-Brady Syndrome  Monitor in 10/2021 showed underlying sinus rhythm with one 5 beat run of NSVT,  9 short runs of SVT (longest run lasting 7 beats), rare PACs/ PVCs, and bradycardia with early morning pauses  (sleeping).  - Well controlled.  - Continue Lopressor 25mg  PRN for palpitations.  Disposition: Follow up with Dr. Ladona Kim in 02/2024 as previously directed.   Signed, Corrin Parker, PA-C  09/10/2023 2:59 PM    [facility]

## 2023-09-10 ENCOUNTER — Encounter: Payer: Self-pay | Admitting: Student

## 2023-09-10 ENCOUNTER — Ambulatory Visit: Attending: Student | Admitting: Student

## 2023-09-10 VITALS — BP 118/70 | HR 60 | Ht 63.0 in | Wt 125.8 lb

## 2023-09-10 DIAGNOSIS — I495 Sick sinus syndrome: Secondary | ICD-10-CM

## 2023-09-10 DIAGNOSIS — Z01818 Encounter for other preprocedural examination: Secondary | ICD-10-CM

## 2023-09-10 DIAGNOSIS — R002 Palpitations: Secondary | ICD-10-CM

## 2023-09-10 NOTE — Patient Instructions (Signed)
Medication Instructions:  No changes *If you need a refill on your cardiac medications before your next appointment, please call your pharmacy*   Lab Work: No labs   Testing/Procedures: No testing   Follow-Up: At [facility], you and your health needs are our priority.  As part of our continuing mission to provide you with exceptional heart care, we have created designated Provider Care Teams.  These Care Teams include your primary Cardiologist (physician) and Advanced Practice Providers (APPs -  Physician Assistants and Nurse Practitioners) who all work together to provide you with the care you need, when you need it.  We recommend signing up for the patient portal called "MyChart".  Sign up information is provided on this After Visit Summary.  MyChart is used to connect with patients for Virtual Visits (Telemedicine).  Patients are able to view lab/test results, encounter notes, upcoming appointments, etc.  Non-urgent messages can be sent to your provider as well.   To learn more about what you can do with MyChart, go to [email or web address].    Your next appointment:   6 month(s)  Provider:   Lewayne Bunting, MD

## 2023-09-24 DIAGNOSIS — Z719 Counseling, unspecified: Secondary | ICD-10-CM

## 2023-09-25 ENCOUNTER — Telehealth: Payer: Self-pay | Admitting: Internal Medicine

## 2023-09-25 NOTE — Telephone Encounter (Signed)
Pt stated that she had questions about her upcoming colonoscopy. Prep letter was reviewed with pt. Pt verbalized understanding with all questions answered.

## 2023-09-25 NOTE — Telephone Encounter (Signed)
Patient called and stated that she has a couple question regarding her prep instruction and she would like to speak to the nurse about them. Please advise.

## 2023-09-27 NOTE — Progress Notes (Signed)
[facility] History and Physical   Primary Care Physician:  [facility]   Reason for Procedure:  History of colon polyps, adenomas and SSP  Plan:    Surveillance colonoscopy     HPI: Kara Kim is a 62 y.o. female status post colonoscopy and polypectomy 10/01/2020 by Dr. Orvan Falconer. - Four 1 mm polyps in the ascending colon and in                            the cecum, removed with a cold biopsy forceps.                            Resected and retrieved. Surgical [P], colon, ascending, cecum, polyps (4) - TUBULAR ADENOMA (X3 FRAGMENTS). - SESSILE SERRATED POLYP WITHOUT DYSPLASIA. - NO HIGH GRADE DYSPLASIA OR MALIGNANCY. Past Medical History:  Diagnosis Date   Anemia    Breast cancer (HCC)    right   Complication of anesthesia    Family history of adverse reaction to anesthesia    mother with history N/V   Heart murmur    then another MD said no murmur   Mitral valve disorders(424.0) 07/29/2013   Osteopenia    PONV (postoperative nausea and vomiting)    Seasonal allergies    Thyroid disease     Past Surgical History:  Procedure Laterality Date   ANAL FISSURE REPAIR     BREAST BIOPSY Right 12/2019   BREAST BIOPSY Right 01/2020   BREAST LUMPECTOMY Right 01/2020   BREAST LUMPECTOMY WITH RADIOACTIVE SEED AND SENTINEL LYMPH NODE BIOPSY Right 02/19/2020   Procedure: RIGHT BREAST LUMPECTOMY X 2  WITH RADIOACTIVE SEED AND SENTINEL LYMPH NODE BIOPSY;  Surgeon: Griselda Miner, MD;  Location: [facility] OR;  Service: General;  Laterality: Right;   CESAREAN SECTION     COLONOSCOPY     eye lid lift     MASTOPEXY Left 02/09/2021   Procedure: Left breast mastopexy for symmetry;  Surgeon: Peggye Form, DO;  Location: [facility];  Service: Plastics;  Laterality: Left;   tummy tuck      Prior to Admission medications   Medication Sig Start Date End Date Taking? Authorizing Provider  ALPRAZolam Prudy Feeler) 0.5 MG tablet  Take 1 tablet (0.5 mg total) by mouth as needed for anxiety. Patient not taking: Reported on 09/10/2023 08/07/23   Loa Socks, NP  Cholecalciferol (VITAMIN D-3) 125 MCG (5000 UT) TABS Take 5,000 Units by mouth daily.     [provider]  fexofenadine (ALLEGRA) 180 MG tablet Take 180 mg by mouth daily.    [provider]  fluticasone (FLONASE) 50 MCG/ACT nasal spray Place 2 sprays into both nostrils daily.    [provider]  metoprolol tartrate (LOPRESSOR) 25 MG tablet Take one tablet by mouth daily as needed for palpitations Patient not taking: Reported on 09/10/2023 04/16/23   Marinus Maw, MD  Multiple Vitamin (MULTIVITAMIN PO) Take by mouth daily.    [provider]  Na Sulfate-K Sulfate-Mg Sulf 17.5-3.13-1.6 GM/177ML SOLN Take by mouth once. Patient not taking: Reported on 09/10/2023 09/07/23   [provider]  Omega-3 Fatty Acids (FISH OIL PO) Take 2,000 mg by mouth daily.    [provider]  OVER THE COUNTER MEDICATION Take 1 tablet by mouth daily. Pt takes Berberine 1 tablet daily.    [provider]  polyethylene glycol (MIRALAX / GLYCOLAX) 17 g packet Take 17 g by mouth daily.    [provider]  Probiotic Product (PROBIOTIC DAILY PO) Take 1 tablet by mouth daily.    [provider]  senna (SENOKOT) 8.6 MG tablet Take 1 tablet by mouth as needed for constipation.    [provider]  tamoxifen (NOLVADEX) 20 MG tablet TAKE 1 TABLET DAILY 08/29/23   Serena Croissant, MD  Tart Cherry 400 MG CHEW Chew by mouth.    [provider]  thyroid (ARMOUR) 60 MG tablet Take 60 mg by mouth daily before breakfast.    [provider]  triamcinolone cream (KENALOG) 0.1 % as needed. 05/11/21   [provider]  vitamin k 100 MCG tablet Take 100 mcg by mouth daily.    [provider]    Current Outpatient Medications  Medication Sig Dispense Refill   Cholecalciferol  (VITAMIN D-3) 125 MCG (5000 UT) TABS Take 5,000 Units by mouth daily.      fexofenadine (ALLEGRA) 180 MG tablet Take 180 mg by mouth daily.     fluticasone (FLONASE) 50 MCG/ACT nasal spray Place 2 sprays into both nostrils daily.     Multiple Vitamin (MULTIVITAMIN PO) Take by mouth daily.     Omega-3 Fatty Acids (FISH OIL PO) Take 2,000 mg by mouth daily.     OVER THE COUNTER MEDICATION Take 1 tablet by mouth daily. Pt takes Berberine 1 tablet daily.     polyethylene glycol (MIRALAX / GLYCOLAX) 17 g packet Take 17 g by mouth daily.     tamoxifen (NOLVADEX) 20 MG tablet TAKE 1 TABLET DAILY 90 tablet 3   ALPRAZolam (XANAX) 0.5 MG tablet Take 1 tablet (0.5 mg total) by mouth as needed for anxiety. (Patient not taking: Reported on 09/10/2023) 2 tablet 0   metoprolol tartrate (LOPRESSOR) 25 MG tablet Take one tablet by mouth daily as needed for palpitations (Patient not taking: Reported on 09/10/2023) 25 tablet 3   Probiotic Product (PROBIOTIC DAILY PO) Take 1 tablet by mouth daily.     senna (SENOKOT) 8.6 MG tablet Take 1 tablet by mouth as needed for constipation.     Tart Cherry 400 MG CHEW Chew by mouth.     thyroid (ARMOUR) 60 MG tablet Take 60 mg by mouth daily before breakfast.     triamcinolone cream (KENALOG) 0.1 % as needed.     vitamin k 100 MCG tablet Take 100 mcg by mouth daily.     Current Facility-Administered Medications  Medication Dose Route Frequency Provider Last Rate Last Admin   0.9 %  sodium chloride infusion  500 mL Intravenous Once Iva Boop, MD        Allergies as of 09/28/2023 - Review Complete 09/28/2023  Allergen Reaction Noted   Egg-derived products Swelling 07/29/2013   Elemental sulfur Other (See Comments) 07/29/2013   Milk (cow)  09/08/2016   Sulfamethoxazole Rash 09/08/2016   Wheat  09/08/2016    Family History  Problem Relation Age of Onset   Lung cancer Mother 14   Hypertension Father    Hypertension Sister    Breast cancer Sister         bilateral; dx 72; neg [employer] Mult-Cancer+RNA   Cancer Paternal Grandmother        unk type d. 61s   Lung cancer Paternal Grandfather        d.>50   Seizures Son    Colon cancer Neg Hx    Esophageal  cancer Neg Hx    Rectal cancer Neg Hx    Stomach cancer Neg Hx    Colon polyps Neg Hx     Social History   Socioeconomic History   Marital status: Married    Spouse name: Not on file   Number of children: Not on file   Years of education: Not on file   Highest education level: Not on file  Occupational History   Not on file  Tobacco Use   Smoking status: Never   Smokeless tobacco: Never  Vaping Use   Vaping status: Never Used  Substance and Sexual Activity   Alcohol use: Yes    Alcohol/week: 4.0 standard drinks of alcohol    Types: 4 Glasses of wine per week    Comment: occasional   Drug use: No   Sexual activity: Not on file  Other Topics Concern   Not on file  Social History Narrative   Not on file   Social Determinants of Health   Financial Resource Strain: Not on file  Food Insecurity: No Food Insecurity (08/09/2022)   [facility], [facility]   Hunger Vital Sign    Worried About Running Out of Food in the Last Year: Never true    Ran Out of Food in the Last Year: Never true  Transportation Needs: Not on file  Physical Activity: Not on file  Stress: Not on file  Social Connections: Unknown (03/07/2022)   [facility], [facility]   Social Network    Social Network: Not on file  Intimate Partner Violence: Unknown (01/27/2022)   Received from [facility], [facility]   HITS    Physically Hurt: Not on file    Insult or Talk Down To: Not on file    Threaten Physical Harm: Not on file    Scream or Curse: Not on file    Review of Systems:  All other review of systems negative except as mentioned in the HPI.  Physical Exam: Vital signs BP 124/81   Pulse 63   Temp (!) 97.3 F (36.3 C)   Ht 5\' 3"  (1.6 m)   Wt 129 lb  (58.5 kg)   SpO2 99%   BMI 22.85 kg/m   General:   Alert,  Well-developed, well-nourished, pleasant and cooperative in NAD Lungs:  Clear throughout to auscultation.   Heart:  Regular rate and rhythm; no murmurs, clicks, rubs,  or gallops. Abdomen:  Soft, nontender and nondistended. Normal bowel sounds.   Neuro/Psych:  Alert and cooperative. Normal mood and affect. A and O x 3   @Merridith Dershem  Sena Slate, MD, [facility] [phone] (pager) 09/28/2023 9:09 AM@

## 2023-09-28 ENCOUNTER — Ambulatory Visit (AMBULATORY_SURGERY_CENTER): Admitting: Internal Medicine

## 2023-09-28 ENCOUNTER — Encounter: Payer: Self-pay | Admitting: Internal Medicine

## 2023-09-28 VITALS — BP 96/69 | HR 55 | Temp 97.3°F | Resp 11 | Ht 63.0 in | Wt 129.0 lb

## 2023-09-28 DIAGNOSIS — Z860101 Personal history of adenomatous and serrated colon polyps: Secondary | ICD-10-CM

## 2023-09-28 DIAGNOSIS — K5730 Diverticulosis of large intestine without perforation or abscess without bleeding: Secondary | ICD-10-CM

## 2023-09-28 DIAGNOSIS — Z1211 Encounter for screening for malignant neoplasm of colon: Secondary | ICD-10-CM

## 2023-09-28 DIAGNOSIS — Z860100 Personal history of colon polyps, unspecified: Secondary | ICD-10-CM | POA: Insufficient documentation

## 2023-09-28 MED ORDER — SODIUM CHLORIDE 0.9 % IV SOLN
500.0000 mL | Freq: Once | INTRAVENOUS | Status: DC
Start: 2023-09-28 — End: 2023-09-28

## 2023-09-28 NOTE — Progress Notes (Signed)
To pacu, VSS. Report to [email or web address] 

## 2023-09-28 NOTE — Op Note (Signed)
[facility] Patient Name: Kara Kim Procedure Date: 09/28/2023 9:14 AM MRN: [id number] Endoscopist: Iva Boop , MD, [id number] Age: 62 Referring MD:  Date of Birth: 1960/12/22 Gender: Female Account #: [id number] Procedure:                Colonoscopy Indications:              Surveillance: Personal history of adenomatous                            polyps on last colonoscopy 3 years ago, Last                            colonoscopy: 2021 Medicines:                Monitored Anesthesia Care Procedure:                Pre-Anesthesia Assessment:                           - Prior to the procedure, a History and Physical                            was performed, and patient medications and                            allergies were reviewed. The patient's tolerance of                            previous anesthesia was also reviewed. The risks                            and benefits of the procedure and the sedation                            options and risks were discussed with the patient.                            All questions were answered, and informed consent                            was obtained. Prior Anticoagulants: The patient has                            taken no anticoagulant or antiplatelet agents. ASA                            Grade Assessment: II - A patient with mild systemic                            disease. After reviewing the risks and benefits,                            the patient was deemed in satisfactory condition to  undergo the procedure.                           After obtaining informed consent, the colonoscope                            was passed under direct vision. Throughout the                            procedure, the patient's blood pressure, pulse, and                            oxygen saturations were monitored continuously. The                            Olympus Scope [id number] was introduced through the                             anus and advanced to the the cecum, identified by                            appendiceal orifice and ileocecal valve. The                            colonoscopy was performed without difficulty. The                            patient tolerated the procedure well. The quality                            of the bowel preparation was good. The ileocecal                            valve, appendiceal orifice, and rectum were                            photographed. The bowel preparation used was SUPREP                            via split dose instruction. Scope In: 9:20:16 AM Scope Out: 9:33:21 AM Scope Withdrawal Time: 0 hours 9 minutes 58 seconds  Total Procedure Duration: 0 hours 13 minutes 5 seconds  Findings:                 The perianal and digital rectal examinations were                            normal.                           Multiple diverticula were found in the sigmoid                            colon.  The exam was otherwise without abnormality on                            direct and retroflexion views. Complications:            No immediate complications. Estimated Blood Loss:     Estimated blood loss: none. Impression:               - Diverticulosis in the sigmoid colon.                           - The examination was otherwise normal on direct                            and retroflexion views.                           - No specimens collected.                           - Personal history of colonic polyps. 3 diminutive                            adenomas and 1 diminutive sessile serrated polyp Recommendation:           - Patient has a contact number available for                            emergencies. The signs and symptoms of potential                            delayed complications were discussed with the                            patient. Return to normal activities tomorrow.                            Written  discharge instructions were provided to the                            patient.                           - Resume previous diet.                           - Continue present medications.                           - Repeat colonoscopy in 5 years for surveillance. Iva Boop, MD 09/28/2023 9:46:26 AM This report has been signed electronically.

## 2023-09-28 NOTE — Patient Instructions (Addendum)
No polyps or cancer seen this time.  Your next routine colonoscopy should be in 5 years - 2029.  I appreciate the opportunity to care for you. Iva Boop, MD, [facility]  Please see handouts given to you on diverticulosis.  YOU HAD AN ENDOSCOPIC PROCEDURE TODAY AT THE [facility]:   Refer to the procedure report that was given to you for any specific questions about what was found during the examination.  If the procedure report does not answer your questions, please call your gastroenterologist to clarify.  If you requested that your care partner not be given the details of your procedure findings, then the procedure report has been included in a sealed envelope for you to review at your convenience later.  YOU SHOULD EXPECT: Some feelings of bloating in the abdomen. Passage of more gas than usual.  Walking can help get rid of the air that was put into your GI tract during the procedure and reduce the bloating. If you had a lower endoscopy (such as a colonoscopy or flexible sigmoidoscopy) you may notice spotting of blood in your stool or on the toilet paper. If you underwent a bowel prep for your procedure, you may not have a normal bowel movement for a few days.  Please Note:  You might notice some irritation and congestion in your nose or some drainage.  This is from the oxygen used during your procedure.  There is no need for concern and it should clear up in a day or so.  SYMPTOMS TO REPORT IMMEDIATELY:  Following lower endoscopy (colonoscopy or flexible sigmoidoscopy):  Excessive amounts of blood in the stool  Significant tenderness or worsening of abdominal pains  Swelling of the abdomen that is new, acute  Fever of 100F or higher  For urgent or emergent issues, a gastroenterologist can be reached at any hour by calling [phone]. Do not use MyChart messaging for urgent concerns.    DIET:  We do recommend a small meal at first, but then you may proceed to your  regular diet.  Drink plenty of fluids but you should avoid alcoholic beverages for 24 hours.  ACTIVITY:  You should plan to take it easy for the rest of today and you should NOT DRIVE or use heavy machinery until tomorrow (because of the sedation medicines used during the test).    FOLLOW UP: Our staff will call the number listed on your records the next business day following your procedure.  We will call around 7:15- 8:00 am to check on you and address any questions or concerns that you may have regarding the information given to you following your procedure. If we do not reach you, we will leave a message.     If any biopsies were taken you will be contacted by phone or by letter within the next 1-3 weeks.  Please call us at [phone] if you have not heard about the biopsies in 3 weeks.    SIGNATURES/CONFIDENTIALITY: You and/or your care partner have signed paperwork which will be entered into your electronic medical record.  These signatures attest to the fact that that the information above on your After Visit Summary has been reviewed and is understood.  Full responsibility of the confidentiality of this discharge information lies with you and/or your care-partner.Byetta,Victoza, Saxenda, Adlyxin - DO NOT TAKE 1 day prior to the procedure

## 2023-10-01 ENCOUNTER — Telehealth: Payer: Self-pay | Admitting: *Deleted

## 2023-10-01 NOTE — Telephone Encounter (Signed)
  Follow up Call-     09/28/2023    8:25 AM  Call back number  Post procedure Call Back phone  # [phone]   [facility]

## 2023-10-02 HISTORY — PX: ROTATOR CUFF REPAIR: SHX139

## 2023-11-06 ENCOUNTER — Other Ambulatory Visit: Admitting: Plastic Surgery

## 2023-11-23 ENCOUNTER — Other Ambulatory Visit: Admitting: Plastic Surgery

## 2023-12-05 ENCOUNTER — Other Ambulatory Visit: Payer: Self-pay | Admitting: Adult Health

## 2023-12-05 DIAGNOSIS — Z853 Personal history of malignant neoplasm of breast: Secondary | ICD-10-CM

## 2023-12-25 ENCOUNTER — Other Ambulatory Visit: Admitting: Plastic Surgery

## 2024-01-01 ENCOUNTER — Other Ambulatory Visit: Admitting: Plastic Surgery

## 2024-01-17 ENCOUNTER — Encounter (HOSPITAL_BASED_OUTPATIENT_CLINIC_OR_DEPARTMENT_OTHER): Payer: Self-pay | Admitting: Emergency Medicine

## 2024-01-17 ENCOUNTER — Emergency Department (HOSPITAL_BASED_OUTPATIENT_CLINIC_OR_DEPARTMENT_OTHER)
Admission: EM | Admit: 2024-01-17 | Discharge: 2024-01-17 | Disposition: A | Discharge status: Home / Self Care | Attending: Emergency Medicine | Admitting: Emergency Medicine

## 2024-01-17 DIAGNOSIS — S61210A Laceration without foreign body of right index finger without damage to nail, initial encounter: Secondary | ICD-10-CM

## 2024-01-17 DIAGNOSIS — S61451A Open bite of right hand, initial encounter: Secondary | ICD-10-CM

## 2024-01-17 DIAGNOSIS — W540XXA Bitten by dog, initial encounter: Secondary | ICD-10-CM | POA: Diagnosis not present

## 2024-01-17 DIAGNOSIS — S6991XA Unspecified injury of right wrist, hand and finger(s), initial encounter: Secondary | ICD-10-CM | POA: Diagnosis present

## 2024-01-17 MED ORDER — AMOXICILLIN-POT CLAVULANATE 875-125 MG PO TABS: 1.0000 | ORAL_TABLET | Freq: Two times a day (BID) | ORAL | 0 refills | Status: DC

## 2024-01-17 NOTE — Discharge Instructions (Addendum)
 Return if any problems.

## 2024-01-17 NOTE — ED Triage Notes (Signed)
 Dog bite New dog just got rabies vaccine 2 days ago, was a stray Small puncture wound on right finger.  Tetanus vaccine updated yesterday

## 2024-01-17 NOTE — ED Provider Notes (Signed)
 Kara Kim EMERGENCY DEPARTMENT AT [facility] Provider Note   CSN: [id number] Arrival date & time: 01/17/24  2101     History  Chief Complaint  Patient presents with   Animal Bite    Kara Kim is a 63 y.o. female.  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  Patient reports that the dog had its rabies shot 2 days ago.  The dog bit her today.  Patient complains of a small abrasion to her hand.  She states the skin was broken and she is concerned about infection and rabies.  Patient reports the dog is well the dog is in her home.  Patient is allergic to sulfa.  Patient denies any fever or chills  The history is provided by the patient. No language interpreter was used.  Animal Bite      Home Medications Prior to Admission medications   Medication Sig Start Date End Date Taking? Authorizing Provider  ALPRAZolam Prudy Feeler) 0.5 MG tablet Take 1 tablet (0.5 mg total) by mouth as needed for anxiety. Patient not taking: Reported on 09/10/2023 08/07/23   Loa Socks, NP  Cholecalciferol (VITAMIN D-3) 125 MCG (5000 UT) TABS Take 5,000 Units by mouth daily.     [provider]  fexofenadine (ALLEGRA) 180 MG tablet Take 180 mg by mouth daily.    [provider]  fluticasone (FLONASE) 50 MCG/ACT nasal spray Place 2 sprays into both nostrils daily.    [provider]  metoprolol tartrate (LOPRESSOR) 25 MG tablet Take one tablet by mouth daily as needed for palpitations Patient not taking: Reported on 09/10/2023 04/16/23   Marinus Maw, MD  Multiple Vitamin (MULTIVITAMIN PO) Take by mouth daily.    [provider]  Omega-3 Fatty Acids (FISH OIL PO) Take 2,000 mg by mouth daily.    [provider]  OVER THE COUNTER MEDICATION Take 1 tablet by mouth daily. Pt takes Berberine 1 tablet daily.    [provider]  polyethylene glycol (MIRALAX / GLYCOLAX) 17 g packet Take 17 g by mouth  daily.    [provider]  Probiotic Product (PROBIOTIC DAILY PO) Take 1 tablet by mouth daily.    [provider]  senna (SENOKOT) 8.6 MG tablet Take 1 tablet by mouth as needed for constipation.    [provider]  tamoxifen (NOLVADEX) 20 MG tablet TAKE 1 TABLET DAILY 08/29/23   Serena Croissant, MD  Tart Cherry 400 MG CHEW Chew by mouth.    [provider]  thyroid (ARMOUR) 60 MG tablet Take 60 mg by mouth daily before breakfast.    [provider]  vitamin k 100 MCG tablet Take 100 mcg by mouth daily.    [provider]      Allergies    Egg-derived products, Elemental sulfur, Milk (cow), Sulfamethoxazole, and Wheat    Review of Systems   Review of Systems  All other systems reviewed and are negative.   Physical Exam Updated Vital Signs BP (!) 147/78 (BP Location: Left Arm)   Pulse 69   Temp 98.3 F (36.8 C) (Oral)   Resp 16   Wt 58.7 kg   SpO2 100%   BMI 22.92 kg/m  Physical Exam Vitals and nursing note reviewed.  Constitutional:      Appearance: She is well-developed.  HENT:     Head: Normocephalic.  Cardiovascular:     Rate and Rhythm: Normal rate.  Pulmonary:  Effort: Pulmonary effort is normal.  Abdominal:     General: There is no distension.  Musculoskeletal:        General: Normal range of motion.     Comments: Superficial abrasion right index finger no erythema no drainage full range of motion neurovascular neurosensory are intact  Skin:    General: Skin is warm.  Neurological:     General: No focal deficit present.     Mental Status: She is alert and oriented to person, place, and time.     ED Results / Procedures / Treatments   Labs (all labs ordered are listed, but only abnormal results are displayed) Labs Reviewed - No data to display  EKG None  Radiology No results found.  Procedures Procedures    Medications Ordered in ED Medications - No data to display  ED Course/ Medical  Decision Making/ A&P                                 Medical Decision Making Patient complains of a dog bite to her right hand.  Patient is the owner of the dog patient had his rabies vaccine 2 days ago  Risk Prescription drug management. Risk Details: Patient advised that the dog will need to be seen by the veterinarian for wellness check animal control will follow up patient is given a prescription for Augmentin.  She is advised to watch for any signs of infection return to the emergency department if any problems.           Final Clinical Impression(s) / ED Diagnoses Final diagnoses:  Laceration of right index finger without foreign body without damage to nail, initial encounter  Dog bite of right hand, initial encounter    Rx / DC Orders ED Discharge Orders          Ordered    amoxicillin-clavulanate (AUGMENTIN) 875-125 MG tablet  2 times daily        01/17/24 2301           An After Visit Summary was printed and given to the patient.    Elson Areas, Cordelia Poche 01/17/24 [id number]    Glyn Ade, MD 01/18/24 [phone]

## 2024-02-11 ENCOUNTER — Ambulatory Visit
Admission: RE | Admit: 2024-02-11 | Discharge: 2024-02-11 | Disposition: A | Discharge status: Home / Self Care | Source: Ambulatory Visit | Attending: Adult Health | Admitting: Adult Health

## 2024-02-11 DIAGNOSIS — Z853 Personal history of malignant neoplasm of breast: Secondary | ICD-10-CM

## 2024-02-26 ENCOUNTER — Other Ambulatory Visit: Payer: Self-pay

## 2024-02-26 ENCOUNTER — Emergency Department (HOSPITAL_COMMUNITY)

## 2024-02-26 ENCOUNTER — Emergency Department (HOSPITAL_COMMUNITY)
Admission: EM | Admit: 2024-02-26 | Discharge: 2024-02-27 | Disposition: A | Discharge status: Home / Self Care | Attending: Emergency Medicine | Admitting: Emergency Medicine

## 2024-02-26 ENCOUNTER — Encounter (HOSPITAL_COMMUNITY): Payer: Self-pay

## 2024-02-26 DIAGNOSIS — Y92007 Garden or yard of unspecified non-institutional (private) residence as the place of occurrence of the external cause: Secondary | ICD-10-CM | POA: Insufficient documentation

## 2024-02-26 DIAGNOSIS — W010XXA Fall on same level from slipping, tripping and stumbling without subsequent striking against object, initial encounter: Secondary | ICD-10-CM | POA: Diagnosis not present

## 2024-02-26 DIAGNOSIS — S42252A Displaced fracture of greater tuberosity of left humerus, initial encounter for closed fracture: Secondary | ICD-10-CM | POA: Diagnosis not present

## 2024-02-26 DIAGNOSIS — M25512 Pain in left shoulder: Secondary | ICD-10-CM | POA: Diagnosis present

## 2024-02-26 DIAGNOSIS — S43015A Anterior dislocation of left humerus, initial encounter: Secondary | ICD-10-CM | POA: Insufficient documentation

## 2024-02-26 MED ORDER — KETAMINE HCL 50 MG/5ML IJ SOSY
2.0000 mg/kg | PREFILLED_SYRINGE | Freq: Once | INTRAMUSCULAR | Status: AC
  Administered 2024-02-26: 114 mg via INTRAVENOUS
  Filled 2024-02-26: qty 15

## 2024-02-26 MED ORDER — ONDANSETRON 4 MG PO TBDP: 4.0000 mg | ORAL_TABLET | Freq: Three times a day (TID) | ORAL | 0 refills | Status: AC | PRN

## 2024-02-26 MED ORDER — FENTANYL CITRATE PF 50 MCG/ML IJ SOSY: 50.0000 ug | PREFILLED_SYRINGE | Freq: Once | INTRAMUSCULAR | Status: DC

## 2024-02-26 MED ORDER — OXYCODONE HCL 5 MG PO TABS: 5.0000 mg | ORAL_TABLET | Freq: Four times a day (QID) | ORAL | 0 refills | Status: AC | PRN

## 2024-02-26 MED ORDER — KETOROLAC TROMETHAMINE 30 MG/ML IJ SOLN
30.0000 mg | Freq: Once | INTRAMUSCULAR | Status: AC
  Administered 2024-02-26: 30 mg via INTRAVENOUS
  Filled 2024-02-26: qty 1

## 2024-02-26 MED ORDER — FENTANYL CITRATE PF 50 MCG/ML IJ SOSY
100.0000 ug | PREFILLED_SYRINGE | Freq: Once | INTRAMUSCULAR | Status: AC
  Administered 2024-02-26: 100 ug via INTRAVENOUS
  Filled 2024-02-26: qty 2

## 2024-02-26 MED ORDER — LORAZEPAM 2 MG/ML IJ SOLN: 1.0000 mg | Freq: Once | INTRAMUSCULAR | Status: DC

## 2024-02-26 NOTE — ED Provider Notes (Addendum)
 .Reduction of dislocation  Date/Time: 02/26/2024 11:54 PM  Performed by: Arvilla Birmingham, MD Authorized by: Arvilla Birmingham, MD  Consent: Written consent obtained. Risks and benefits: risks, benefits and alternatives were discussed Consent given by: patient and spouse Patient understanding: patient states understanding of the procedure being performed Patient consent: the patient's understanding of the procedure matches consent given Procedure consent: procedure consent matches procedure scheduled Relevant documents: relevant documents present and verified Test results: test results available and properly labeled Site marked: the operative site was marked Imaging studies: imaging studies available Required items: required blood products, implants, devices, and special equipment available Patient identity confirmed: hospital-assigned identification number Time out: Immediately prior to procedure a "time out" was called to verify the correct patient, procedure, equipment, support staff and site/side marked as required. Preparation: Patient was prepped and draped in the usual sterile fashion. Local anesthesia used: no  Anesthesia: Local anesthesia used: no  Sedation: Patient sedated: yes Sedation type: moderate (conscious) sedation Sedatives: ketamine  Analgesia: fentanyl  Vitals: Vital signs were monitored during sedation.  Patient tolerance: patient tolerated the procedure well with no immediate complications   .Sedation  Date/Time: 02/26/2024 11:54 PM  Performed by: Arvilla Birmingham, MD Authorized by: Arvilla Birmingham, MD   Consent:    Consent obtained:  Verbal   Consent given by:  Patient and spouse   Risks discussed:  Dysrhythmia, allergic reaction, inadequate sedation, nausea, vomiting, respiratory compromise necessitating ventilatory assistance and intubation, prolonged sedation necessitating reversal and prolonged hypoxia resulting in organ damage Universal protocol:     Procedure explained and questions answered to patient or proxy's satisfaction: yes     Relevant documents present and verified: yes     Test results available: yes     Imaging studies available: yes     Required blood products, implants, devices, and special equipment available: yes     Site/side marked: yes     Immediately prior to procedure, a time out was called: yes     Patient identity confirmed:  Arm band Indications:    Procedure performed:  Dislocation reduction   Procedure necessitating sedation performed by:  Physician performing sedation Pre-sedation assessment:    Time since last food or drink:  6 hour   ASA classification: class 2 - patient with mild systemic disease     Mallampati score:  II - soft palate, uvula, fauces visible   Neck mobility: normal     Pre-sedation assessments completed and reviewed: airway patency, cardiovascular function, hydration status, mental status, nausea/vomiting, pain level and respiratory function     History of difficult intubation: yes   A pre-sedation assessment was completed prior to the start of the procedure Immediate pre-procedure details:    Reassessment: Patient reassessed immediately prior to procedure     Reviewed: vital signs, relevant labs/tests and NPO status     Verified: bag valve mask available, emergency equipment available, intubation equipment available, IV patency confirmed, oxygen available, reversal medications available and suction available   Procedure details (see MAR for exact dosages):    Preoxygenation:  Nasal cannula   Sedation:  Ketamine    Intended level of sedation: deep and moderate (conscious sedation)   Analgesia:  Fentanyl    Intra-procedure monitoring:  Blood pressure monitoring, cardiac monitor, continuous capnometry, continuous pulse oximetry, frequent LOC assessments and frequent vital sign checks   Intra-procedure events: none     Total Provider sedation time (minutes):  30 Post-procedure details:    A post-sedation assessment was completed following the  completion of the procedure.   Attendance: Constant attendance by certified staff until patient recovered     Recovery: Patient returned to pre-procedure baseline     Post-sedation assessments completed and reviewed: airway patency, cardiovascular function, hydration status, mental status, nausea/vomiting, pain level, respiratory function and temperature     Patient is stable for discharge or admission: yes     Procedure completion:  Tolerated well, no immediate complications      Arvilla Birmingham, MD 03/07/24 [phone]

## 2024-02-26 NOTE — ED Triage Notes (Signed)
 Pt BIBA after a mechanical fall in her yard and landing on her right arm. Pt reports numbness to he fingers.Cool to touch but pulses intact. Denies any LOC as well as no blood thinners.  20G R-Wrist 84-146/70-99%RA  100mcg Fentanyl  12mg  Ketamine

## 2024-02-26 NOTE — ED Notes (Signed)
 RT called for conscious sedation.

## 2024-02-26 NOTE — ED Provider Notes (Signed)
 Kara Kim AT [facility] Provider Note   CSN: [id number] Arrival date & time: 02/26/24  1859     History  Chief Complaint  Patient presents with   Kara Kim    Kara Kim is a 63 y.o. female with past medical history significant for tachybradycardia syndrome, presents with concern for left shoulder pain after a mechanical fall in her yard today.  States she tripped, landing on her left arm.  Denies any numbness or tingling in the left upper extremity.  Denies hitting her head or any loss of consciousness.  Denies pain anywhere else.  HPI     Home Medications Prior to Admission medications   Medication Sig Start Date End Date Taking? Authorizing Provider  ondansetron  (ZOFRAN -ODT) 4 MG disintegrating tablet Take 1 tablet (4 mg total) by mouth every 8 (eight) hours as needed for nausea or vomiting. 02/26/24  Yes Rexie Catena, PA-C  oxyCODONE  (ROXICODONE ) 5 MG immediate release tablet Take 1 tablet (5 mg total) by mouth every 6 (six) hours as needed for up to 5 days for severe pain (pain score 7-10) or breakthrough pain (Pain not controlled with Tylenol  and Ibuprofen). 02/26/24 03/02/24 Yes Rexie Catena, PA-C  ALPRAZolam  (XANAX ) 0.5 MG tablet Take 1 tablet (0.5 mg total) by mouth as needed for anxiety. Patient not taking: Reported on 09/10/2023 08/07/23   Percival Brace, NP  amoxicillin -clavulanate (AUGMENTIN ) 875-125 MG tablet Take 1 tablet by mouth 2 (two) times daily. 01/17/24   Sofia, Leslie K, PA-C  Cholecalciferol (VITAMIN D-3) 125 MCG (5000 UT) TABS Take 5,000 Units by mouth daily.     [provider]  fexofenadine (ALLEGRA) 180 MG tablet Take 180 mg by mouth daily.    [provider]  fluticasone (FLONASE) 50 MCG/ACT nasal spray Place 2 sprays into both nostrils daily.    [provider]  metoprolol  tartrate (LOPRESSOR ) 25 MG tablet Take one tablet by mouth daily as needed for palpitations Patient not  taking: Reported on 09/10/2023 04/16/23   Tammie Fall, MD  Multiple Vitamin (MULTIVITAMIN PO) Take by mouth daily.    [provider]  Omega-3 Fatty Acids (FISH OIL PO) Take 2,000 mg by mouth daily.    [provider]  OVER THE COUNTER MEDICATION Take 1 tablet by mouth daily. Pt takes Berberine 1 tablet daily.    [provider]  polyethylene glycol (MIRALAX / GLYCOLAX) 17 g packet Take 17 g by mouth daily.    [provider]  Probiotic Product (PROBIOTIC DAILY PO) Take 1 tablet by mouth daily.    [provider]  senna (SENOKOT) 8.6 MG tablet Take 1 tablet by mouth as needed for constipation.    [provider]  tamoxifen  (NOLVADEX ) 20 MG tablet TAKE 1 TABLET DAILY 08/29/23   Gudena, Vinay, MD  Tart Cherry 400 MG CHEW Chew by mouth.    [provider]  thyroid  (ARMOUR) 60 MG tablet Take 60 mg by mouth daily before breakfast.    [provider]  vitamin k 100 MCG tablet Take 100 mcg by mouth daily.    [provider]      Allergies    Egg-derived products, Elemental sulfur , Milk (cow), Sulfamethoxazole, and Wheat    Review of Systems   Review of Systems  Musculoskeletal:        Left shoulder pain    Physical Exam Updated Vital Signs BP (!) 183/110   Pulse 84   Temp 98 F (36.7  C)   Resp 18   Ht 5\' 3"  (1.6 m)   Wt 57.2 kg   SpO2 100%   BMI 22.32 kg/m  Physical Exam Vitals and nursing note reviewed.  Constitutional:      General: She is not in acute distress.    Appearance: Normal appearance.  HENT:     Head: Normocephalic and atraumatic.     Comments: No contusions, lacerations, or abrasions Cardiovascular:     Comments: 2+ radial pulse bilaterally Cap refill less than 2 seconds in the left upper extremity Pulmonary:     Effort: Pulmonary effort is normal.  Musculoskeletal:     Comments: Left upper extremity:  General Left shoulder with obvious deformity, appears out of place. Also  mild edema present. No erythema, contusions, open wounds   Palpation Tender palpation over the glenohumeral joint and humeral head.  No tenderness to the distal humerus, radius, ulna, carpal bones, 1st through 5th metacarpals or phalanges. Non-tender to palpation along the clavicle, AC joint  ROM Unable to perform left shoulder range of motion due to pain.  Able to fully flex and extend at the left elbow and wrist  Sensation: Sensation intact throughout the upper extremity  Strength: 5/5 strength with resisted wrist flexion extension    Right upper extremity without any deformity, no tenderness to palpation diffusely.  Full range of motion of the right upper extremity.    Neurological:     General: No focal deficit present.     Mental Status: She is alert.  Psychiatric:        Mood and Affect: Mood normal.        Behavior: Behavior normal.     ED Results / Procedures / Treatments   Labs (all labs ordered are listed, but only abnormal results are displayed) Labs Reviewed - No data to display  EKG None  Radiology DG Shoulder Left Portable Result Date: 02/26/2024 CLINICAL DATA:  Status post reduction EXAM: LEFT SHOULDER COMPARISON:  Film from earlier in the same day. FINDINGS: Humeral head is now well seated. The greater tuberosity fragment is well seated. No soft tissue changes are noted. IMPRESSION: Status post reduction. The greater tuberosity fracture is well seated. Electronically Signed   By: Violeta Grey M.D.   On: 02/26/2024 21:41   DG Shoulder Left Result Date: 02/26/2024 CLINICAL DATA:  Fall.  Left shoulder pain. EXAM: LEFT SHOULDER - 2+ VIEW COMPARISON:  None Available. FINDINGS: There is anterior inferior dislocation of the humeral head with respect to the glenoid fossa. There is an approximate 2.1 x 3.0 cm (transverse by craniocaudal) bone fragment from the posterior superolateral humeral head displaced approximately 2.7 cm laterally and approximately 0.9 cm  inferiorly with respect to the dominant humeral head. This is suggestive of a displaced Hill-Sachs impaction fracture from the anterior shoulder dislocation. No definite glenoid Bankart fracture is visualized. IMPRESSION: 1. Anterior inferior dislocation of the humeral head with respect to the glenoid fossa. 2. Displaced bone fragment from the posterior superolateral humeral head suggestive of a displaced Hill-Sachs impaction fracture. Electronically Signed   By: Bertina Broccoli M.D.   On: 02/26/2024 19:59    Procedures Procedures    Medications Ordered in ED Medications  ketorolac (TORADOL) 30 MG/ML injection 30 mg (30 mg Intravenous Given 02/26/24 1942)  fentaNYL  (SUBLIMAZE ) injection 100 mcg (100 mcg Intravenous Given 02/26/24 1944)  ketamine 50 mg in normal saline 5 mL (10 mg/mL) syringe (114 mg Intravenous Given 02/26/24 2122)    ED Course/  Medical Decision Making/ A&P Clinical Course as of 02/26/24 2237  Tue Feb 26, 2024  1928 Pt in significant pain in left shoulder, will get x-ray and ordered pain medications [AF]    Clinical Course User Index [AF] Rexie Catena, PA-C                                 Medical Decision Making Amount and/or Complexity of Data Reviewed Radiology: ordered.  Risk Prescription drug management.     Differential diagnosis includes but is not limited to fracture, dislocation, sprain, sprain injury, nerve injury  ED Course:  Upon initial evaluation, patient is uncomfortable, appears to be in significant pain upon arrival.  Left shoulder obviously deformed.  Neurovascularly intact in the bilateral upper extremities upon arrival.  She was given ketamine and fentanyl  by EMS prior to arrival, given an additional 1000 mcg fentanyl  per Dr. Gordon Latus for pain upon arrival here to the ER. Also given toradol for pain.    Imaging Studies ordered: I ordered imaging studies including x-ray left shoulder I independently visualized the imaging with scope of  interpretation limited to determining acute life threatening conditions related to emergency care. Imaging showed  X-ray of the left shoulder showed an anterior-inferior dislocation of the left humeral head.  There also appears to be a displaced greater tuberosity fracture of the left humeral head X-ray of the left shoulder after reduction showed appropriate placement of the humeral head.  Greater tuberosity fracture well-seated. I agree with the radiologist interpretation   Consultations Obtained: I requested consultation with the Dr. Leighton Punches with orthopedics,  and discussed lab and imaging findings as well as pertinent plan - they recommend: Reduction and placement in sling.  They recommended follow-up CT of the area for further evaluation as an outpatient. Recommended follow-up in office within the next week.  Medications Given: Fentanyl  and Toradol for pain Ketamine for conscious sedation  Conscious sedation performed by my attending Dr. Gordon Latus who also performed a reduction of the left shoulder.  This was successful and the patient was placed into a shoulder sling for immobilization.  Remains neurovascularly intact after reduction.  We will have her follow-up with orthopedics within the next week. Patient does remain quite sleepy after the reduction.  Will allow her time to metabolize before discharge.    Impression: Left shoulder dislocation Left greater tuberosity fracture  Disposition:  Care of this patient signed out to oncoming ED provider Dorisann Garre, PA-C to ensure patient is alert enough to go home as she is still sleepy from the sedation.  Patient has husband at bedside who can drive her home.  Disposition and treatment plan pending imaging results and clinical judgment of oncoming ED team.      Record Review: External records from outside source obtained and reviewed including previous orthopedic visits with Dr. Alfredo Ano     This chart was dictated using voice  recognition software, Dragon. Despite the best efforts of this provider to proofread and correct errors, errors may still occur which can change documentation meaning.          Final Clinical Impression(s) / ED Diagnoses Final diagnoses:  Anterior dislocation of left shoulder, initial encounter  Displaced fracture of greater tuberosity of left humerus, initial encounter for closed fracture    Rx / DC Orders ED Discharge Orders          Ordered    oxyCODONE  (ROXICODONE ) 5 MG immediate release  tablet  Every 6 hours PRN        02/26/24 2223    ondansetron  (ZOFRAN -ODT) 4 MG disintegrating tablet  Every 8 hours PRN        02/26/24 2223              Rexie Catena, PA-C 02/26/24 2237    Arvilla Birmingham, MD 02/26/24 [phone]

## 2024-02-26 NOTE — ED Provider Notes (Signed)
   Accepted handoff at shift change from Amanda PA-C. Please see prior provider note for more detail.   Briefly: Patient is 63 y.o. "concern for left shoulder pain after a mechanical fall in her yard today. States she tripped, landing on her left arm. Denies any numbness or tingling in the left upper extremity. Denies hitting her head or any loss of consciousness. Denies pain anywhere else."  DDX: concern for  fracture, dislocation, sprain, sprain injury, nerve injury   Plan:  - conscious sedation performed by Dr. Gordon Latus for shoulder reduction. Patient still feeling very drowsy and nauseated afterwards. Plan to reassess and probable discharge after patient is feeling better.  - I reassessed patient and she is tolerating PO and states that she is feeling better and is ready to go home. Patient given information to follow up with orthopedic providers. Prior provider prescribing Oxy for breakthrough pain. - Patient afebrile with stable vitals.  Provided with return precautions.  Discharge in good condition.       [place], [place] 02/26/24 [id number]    Arvilla Birmingham, MD 02/26/24 [phone]

## 2024-02-26 NOTE — Discharge Instructions (Addendum)
 You dislocated your left shoulder today.  This was put back into place.  Please keep the sling on your arm at all times to keep the shoulder in place.  Your x-rays today shows that you have a fracture of the top part of your humerus (greater tuberosity).   You may take up to 1000mg  of tylenol  every 6 hours as needed for pain. Do not take more then 4g per day.   You may use up to 600mg  ibuprofen every 6 hours as needed for pain.  Do not exceed 2.4g of ibuprofen per day.  You were given your first dose here today at 7:30 PM, your next dose can be at 1:30 AM.  You may alternate these medications for complete coverage. See instructions below: Take 600mg  ibuprofen, then 3 hours later take 1000mg  tylenol , then 3 hours later 600mg  ibuprofen, then 3 hours later 1000mg  tylenol , so on and so forth for pain control.    You have been prescribed Oxycodone -this is a narcotic/controlled substance medication that has potential addicting qualities.  You may take 1 tablet every 6 hours as needed for severe pain not controlled with tylenol  and ibuprofen.  Do not drive or operate heavy machinery when taking this medicine as it can be sedating. Do not drink alcohol or take other sedating medications when taking this medicine for safety reasons.  Keep this out of reach of small children.    You have been prescribed Zofran  (ondansetron ) for nausea and vomiting. You may take this every 8 hours as needed for nausea and vomiting. This medication dissolves under the tongue. You do not need to swallow it.   Call the orthopedics office listed below as soon as possible to schedule a follow-up appointment within the next week.   Return to the ER for any uncontrolled pain, numbness or tingling, discoloration, any other new or concerning symptoms.

## 2024-02-27 NOTE — ED Notes (Signed)
 Pt tolerated po challenge w/ saltine crackers and ginger ale w/out any difficulties.

## 2024-03-06 ENCOUNTER — Telehealth: Payer: Self-pay | Admitting: *Deleted

## 2024-03-06 NOTE — Telephone Encounter (Signed)
   Name: Ninetta Croslin Overturf  DOB: Apr 28, 1961  MRN: [id number]  Primary Cardiologist: Manya Sells, MD  Chart reviewed as part of pre-operative protocol coverage. The patient has an upcoming visit scheduled with Michaelle Adolphus, PA on 03/07/2024 at which time clearance can be addressed in case there are any issues that would impact surgical recommendations.  Shoulder surgery is not scheduled until 03/13/2024 as below. I added preop FYI to appointment note so that provider is aware to address at time of outpatient visit.  Per office protocol the cardiology provider should forward their finalized clearance decision and recommendations regarding antiplatelet therapy to the requesting party below.    No medications requested to be held.  I will route this message as FYI to requesting party and remove this message from the preop box as separate preop APP input not needed at this time.   Please call with any questions.  Ava Boatman, NP  03/06/2024, 11:14 AM

## 2024-03-06 NOTE — Telephone Encounter (Signed)
 D/w Michaelle Adolphus, PAC who agrees to he will assess pt for preop clearance tomorrow at planned appt. I will update all parties involved.

## 2024-03-06 NOTE — Telephone Encounter (Signed)
   Pre-operative Risk Assessment    Patient Name: Kara Kim  DOB: Jun 12, 1961 MRN: [id number]   Date of last office visit: 09/10/23 Sharren Decree, PAC Date of next office visit: 03/07/24 Michaelle Adolphus, [facility]   Request for Surgical Clearance    Procedure:  ANTERIOR DISLOCATION OF LEFT SHOULDER; DISPLACE Fx OF GREATER TUBEROSITY OF LEFT HUMERUS  Date of Surgery:  Clearance 03/13/24                                Surgeon:  DR. KEVIN SUPPLE Surgeon's Group or Practice Name:  [facility] Phone number:  [phone] LORI STONE Fax number:  [phone]   Type of Clearance Requested:   - Medical ; NONE INDICATED ON FORM TO BE HELD   Type of Anesthesia:  General    Additional requests/questions:    [name]   03/06/2024, 10:46 AM

## 2024-03-07 ENCOUNTER — Encounter: Payer: Self-pay | Admitting: Student

## 2024-03-07 ENCOUNTER — Encounter: Payer: Self-pay | Admitting: Hematology and Oncology

## 2024-03-07 ENCOUNTER — Ambulatory Visit: Attending: Student | Admitting: Student

## 2024-03-07 VITALS — BP 116/72 | HR 66 | Ht 63.0 in | Wt 128.0 lb

## 2024-03-07 DIAGNOSIS — I493 Ventricular premature depolarization: Secondary | ICD-10-CM | POA: Diagnosis not present

## 2024-03-07 DIAGNOSIS — Z01818 Encounter for other preprocedural examination: Secondary | ICD-10-CM | POA: Diagnosis not present

## 2024-03-07 DIAGNOSIS — R002 Palpitations: Secondary | ICD-10-CM

## 2024-03-07 DIAGNOSIS — I491 Atrial premature depolarization: Secondary | ICD-10-CM | POA: Diagnosis not present

## 2024-03-07 MED ORDER — METOPROLOL TARTRATE 25 MG PO TABS
ORAL_TABLET | ORAL | 3 refills | Status: AC
Start: 2024-03-07 — End: ?

## 2024-03-07 NOTE — Progress Notes (Signed)
  Electrophysiology Office Note:   Date:  03/07/2024  ID:  Kara Kim, DOB [birth year], MRN [id number]  Primary Cardiologist: None Electrophysiologist: Manya Sells, MD      History of Present Illness:   Kara Kim is a 63 y.o. female with h/o palpitations, PVCs, and PACs seen today for cardiac clearance for Shoulder surgery.  Pt fell in her yard 02/26/2023 and was seen in ED for shoulder dislocation and humeral head fracture. Was sedated and reduced in ED and followed up with Ortho, now scheduled for surgery.   Since last being seen in our clinic the patient reports doing well from a cardiac perspective. Still has brief palpitations from time to time, but has not needed lopressor . Otherwise, she denies chest pain, dyspnea, PND, orthopnea, nausea, vomiting, dizziness, syncope, edema, weight gain, or early satiety.  Review of systems complete and found to be negative unless listed in HPI.   EP Information / Studies Reviewed:    EKG is ordered today. Personal review as below.  EKG Interpretation Date/Time:  Friday Mar 07 2024 12:01:46 EDT Ventricular Rate:  54 PR Interval:  216 QRS Duration:  86 QT Interval:  412 QTC Calculation: 390 R Axis:   53  Text Interpretation: Sinus bradycardia with 1st degree A-V block When compared with ECG of 10-Sep-2023 14:00, No significant change was found Confirmed by Pilar Bridge [phone]) on 03/07/2024 12:03:19 PM    Arrhythmia/Device History No specialty comments available.   Physical Exam:   VS:  BP 116/72 (BP Location: Right Arm, Patient Position: Sitting, Cuff Size: Normal)   Pulse 66   Ht 5\' 3"  (1.6 m)   Wt 128 lb (58.1 kg)   SpO2 97%   BMI 22.67 kg/m    Wt Readings from Last 3 Encounters:  03/07/24 128 lb (58.1 kg)  02/26/24 126 lb (57.2 kg)  01/17/24 129 lb 6.6 oz (58.7 kg)     GEN: No acute distress NECK: No JVD; No carotid bruits CARDIAC: Regular rate and rhythm, no murmurs, rubs, gallops RESPIRATORY:  Clear to  auscultation without rales, wheezing or rhonchi  ABDOMEN: Soft, non-tender, non-distended EXTREMITIES:  No edema; No deformity   ASSESSMENT AND PLAN:    Palpitations PACs PVCs EKG today shows sinus bradycardia without ectopy We have discussed the benign nature of her problem Will avoid AA drug therapy if able.  Symptoms currently well controlled on as needed lopressor .   Cardiac Clearance for shoulder surgery No concerns from a cardiac perspective.  The patient is at low risk to proceed without further work up.  If the patient has new chest pain or SOB prior to surgery, they should be revaluated.     Follow up with EP APP in 12 months  Signed, Tylene Galla, PA-C

## 2024-03-07 NOTE — Addendum Note (Signed)
 Addended by: Clive Dancer on: 03/07/2024 12:15 PM   Modules accepted: Orders

## 2024-03-07 NOTE — Patient Instructions (Signed)
 Medication Instructions:  No medication changes today. *If you need a refill on your cardiac medications before your next appointment, please call your pharmacy*  Lab Work: No labwork ordered today. If you have labs (blood work) drawn today and your tests are completely normal, you will receive your results only by: MyChart Message (if you have MyChart) OR A paper copy in the mail If you have any lab test that is abnormal or we need to change your treatment, we will call you to review the results.  Testing/Procedures: No testing ordered today  Follow-Up: At [facility], you and your health needs are our priority.  As part of our continuing mission to provide you with exceptional heart care, our providers are all part of one team.  This team includes your primary Cardiologist (physician) and Advanced Practice Providers or APPs (Physician Assistants and Nurse Practitioners) who all work together to provide you with the care you need, when you need it.  Your next appointment:   12 month(s)  Provider:   You may see Manya Sells, MD or one of the following Advanced Practice Providers on your designated Care Team:   Mertha Abrahams, Kennard Pea "Jonelle Neri" [place], PA-C Suzann Riddle, NP Creighton Doffing, NP    We recommend signing up for the patient portal called "MyChart".  Sign up information is provided on this After Visit Summary.  MyChart is used to connect with patients for Virtual Visits (Telemedicine).  Patients are able to view lab/test results, encounter notes, upcoming appointments, etc.  Non-urgent messages can be sent to your provider as well.   To learn more about what you can do with MyChart, go to [email or web address].

## 2024-08-10 ENCOUNTER — Other Ambulatory Visit

## 2024-08-18 ENCOUNTER — Other Ambulatory Visit: Payer: Self-pay | Admitting: Hematology and Oncology

## 2024-08-18 ENCOUNTER — Encounter: Payer: Self-pay | Admitting: Hematology and Oncology

## 2024-08-18 MED ORDER — ALPRAZOLAM 0.5 MG PO TABS: 0.5000 mg | ORAL_TABLET | ORAL | 0 refills | Status: DC | PRN

## 2024-08-22 ENCOUNTER — Ambulatory Visit
Admission: RE | Admit: 2024-08-22 | Discharge: 2024-08-22 | Disposition: A | Discharge status: Home / Self Care | Source: Ambulatory Visit | Attending: Hematology and Oncology | Admitting: Hematology and Oncology

## 2024-08-22 DIAGNOSIS — Z170 Estrogen receptor positive status [ER+]: Secondary | ICD-10-CM

## 2024-08-22 MED ORDER — GADOPICLENOL 0.5 MMOL/ML IV SOLN
7.5000 mL | Freq: Once | INTRAVENOUS | Status: AC | PRN
  Administered 2024-08-22: 7.5 mL via INTRAVENOUS

## 2024-08-25 ENCOUNTER — Ambulatory Visit: Admitting: Hematology and Oncology

## 2024-09-02 NOTE — Assessment & Plan Note (Signed)
 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   Breast cancer surveillance: Breast MRI 08/13/2023: Benign breast density category D Mammograms 02/09/23: Benign   Recommended annual MRIs for surveillance given high breast density.   Constipation Chronic issue, worsened over the past month. Currently using Miralax daily with limited relief. -Start daily Senna or similar stool softener. -Use Miralax as needed for additional relief.     Follow-up in 1 year.

## 2024-09-03 ENCOUNTER — Inpatient Hospital Stay: Attending: Hematology and Oncology | Admitting: Hematology and Oncology

## 2024-09-03 VITALS — BP 137/68 | HR 66 | Temp 97.9°F | Resp 18 | Ht 63.0 in | Wt 132.8 lb

## 2024-09-03 DIAGNOSIS — K5909 Other constipation: Secondary | ICD-10-CM | POA: Insufficient documentation

## 2024-09-03 DIAGNOSIS — Z1732 Human epidermal growth factor receptor 2 negative status: Secondary | ICD-10-CM | POA: Diagnosis not present

## 2024-09-03 DIAGNOSIS — Z170 Estrogen receptor positive status [ER+]: Secondary | ICD-10-CM | POA: Insufficient documentation

## 2024-09-03 DIAGNOSIS — Z79810 Long term (current) use of selective estrogen receptor modulators (SERMs): Secondary | ICD-10-CM | POA: Insufficient documentation

## 2024-09-03 DIAGNOSIS — Z923 Personal history of irradiation: Secondary | ICD-10-CM | POA: Insufficient documentation

## 2024-09-03 DIAGNOSIS — Z1721 Progesterone receptor positive status: Secondary | ICD-10-CM | POA: Diagnosis not present

## 2024-09-03 DIAGNOSIS — K7689 Other specified diseases of liver: Secondary | ICD-10-CM | POA: Diagnosis not present

## 2024-09-03 DIAGNOSIS — K769 Liver disease, unspecified: Secondary | ICD-10-CM

## 2024-09-03 DIAGNOSIS — C50411 Malignant neoplasm of upper-outer quadrant of right female breast: Secondary | ICD-10-CM | POA: Insufficient documentation

## 2024-09-03 NOTE — Progress Notes (Signed)
 Patient Care Team: Associates, [facility] as PCP - General (Family Medicine) Waddell Danelle ORN, MD as PCP - Electrophysiology (Cardiology) Curvin Deward MOULD, MD as Consulting Physician (General Surgery) Dewey Rush, MD as Consulting Physician (Radiation Oncology) Burnette Fallow, MD as Consulting Physician (Gastroenterology) Robinson Pao, MD as Consulting Physician (Dermatology) Zackary Megan ORN, MD (Family Medicine) Latisha Medford, MD as Consulting Physician (Obstetrics and Gynecology) Tyree Nanetta SAILOR, RN as Oncology Nurse Navigator Dillingham, Estefana RAMAN, DO as Attending Physician (Plastic Surgery) Eda Iha, MD (Inactive) as Consulting Physician (Gastroenterology)  DIAGNOSIS:  Encounter Diagnosis  Name Primary?   Malignant neoplasm of upper-outer quadrant of right breast in female, estrogen receptor positive (HCC) Yes    SUMMARY OF ONCOLOGIC HISTORY: Oncology History  Malignant neoplasm of upper-outer quadrant of right breast in female, estrogen receptor positive (HCC)  12/26/2019 Cancer Staging   Staging form: Breast, AJCC 8th Edition - Clinical stage from 12/26/2019: Stage IA (cT1b, cN0, cM0, G2, ER+, PR+, HER2-) - Signed by Crawford Morna Pickle, NP on 01/07/2020   01/07/2020 Initial Diagnosis   Malignant neoplasm of upper-outer quadrant of right breast in female, estrogen receptor positive (HCC)     CHIEF COMPLIANT:   HISTORY OF PRESENT ILLNESS:  History of Present Illness Kara Kim is a 63 year old female with a history of breast cancer who presents with concerns about liver spots found on a recent MRI.  A recent MRI revealed two bright spots on the liver, measuring 2.4 cm and 1.5 cm, raising concerns about potential malignancy. She has a history of breast cancer and squamous cell carcinoma, which heightens her anxiety about these findings.  She has been on tamoxifen  for four years following breast cancer treatment. Despite this,  her breasts remain dense, which is concerning given the expectation of reduced density over five years. She experiences chronic constipation and weight gain, which she attributes to tamoxifen .  Her family history includes her father, who died at 107, contributing to her health anxieties. She is diligent about breast cancer surveillance, undergoing regular MRIs and mammograms. She wears a silicone breast insert and mastectomy bras following a lumpectomy. She describes herself as a 'weekend drinker' with periods of abstinence.     ALLERGIES:  is allergic to egg protein-containing drug products, elemental sulfur , sulfamethoxazole, milk (cow), and wheat.  MEDICATIONS:  Current Outpatient Medications  Medication Sig Dispense Refill   ALPRAZolam  (XANAX ) 0.5 MG tablet Take 1 tablet (0.5 mg total) by mouth as needed for anxiety. 2 tablet 0   Cholecalciferol (VITAMIN D-3) 125 MCG (5000 UT) TABS Take 5,000 Units by mouth daily.      fexofenadine (ALLEGRA) 180 MG tablet Take 180 mg by mouth daily.     fluticasone (FLONASE) 50 MCG/ACT nasal spray Place 2 sprays into both nostrils daily.     metoprolol  tartrate (LOPRESSOR ) 25 MG tablet Take one tablet by mouth daily as needed for palpitations 30 tablet 3   Multiple Vitamin (MULTIVITAMIN PO) Take by mouth daily.     Omega-3 Fatty Acids (FISH OIL PO) Take 2,000 mg by mouth daily.     ondansetron  (ZOFRAN -ODT) 4 MG disintegrating tablet Take 1 tablet (4 mg total) by mouth every 8 (eight) hours as needed for nausea or vomiting. 20 tablet 0   OVER THE COUNTER MEDICATION Take 1 tablet by mouth daily. Pt takes Berberine 1 tablet daily.     polyethylene glycol (MIRALAX / GLYCOLAX) 17 g packet Take 17 g by mouth daily.  Probiotic Product (PROBIOTIC DAILY PO) Take 1 tablet by mouth daily.     senna (SENOKOT) 8.6 MG tablet Take 1 tablet by mouth as needed for constipation.     tamoxifen  (NOLVADEX ) 20 MG tablet TAKE 1 TABLET DAILY 90 tablet 3   Tart Cherry 400 MG  CHEW Chew by mouth.     thyroid  (ARMOUR) 60 MG tablet Take 60 mg by mouth daily before breakfast.     vitamin k 100 MCG tablet Take 100 mcg by mouth daily.     No current facility-administered medications for this visit.    PHYSICAL EXAMINATION: ECOG PERFORMANCE STATUS: 1 - Symptomatic but completely ambulatory  Vitals:   09/03/24 1126  BP: 137/68  Pulse: 66  Resp: 18  Temp: 97.9 F (36.6 C)  SpO2: 100%   Filed Weights   09/03/24 1126  Weight: 132 lb 12.8 oz (60.2 kg)    Physical Exam   (exam performed in the presence of a chaperone)  LABORATORY DATA:  I have reviewed the data as listed    Latest Ref Rng & Units 08/23/2022   11:56 AM 02/12/2020   12:00 PM 01/07/2020    3:35 PM  CMP  Glucose 70 - 99 mg/dL 90  83  840   BUN 8 - 23 mg/dL 18  21  13    Creatinine 0.44 - 1.00 mg/dL 9.02  9.08  8.95   Sodium 135 - 145 mmol/L 141  140  144   Potassium 3.5 - 5.1 mmol/L 3.8  4.1  4.2   Chloride 98 - 111 mmol/L 107  102  106   CO2 22 - 32 mmol/L 28  29  28    Calcium 8.9 - 10.3 mg/dL 8.9  9.5  8.8   Total Protein 6.5 - 8.1 g/dL 6.7   6.4   Total Bilirubin 0.3 - 1.2 mg/dL 0.8   0.6   Alkaline Phos 38 - 126 U/L 53   62   AST 15 - 41 U/L 23   23   ALT 0 - 44 U/L 17   16     Lab Results  Component Value Date   WBC 3.6 (L) 08/23/2022   HGB 13.5 08/23/2022   HCT 39.3 08/23/2022   MCV 95.6 08/23/2022   PLT 182 08/23/2022   NEUTROABS 2.2 08/23/2022    ASSESSMENT & PLAN:  Malignant neoplasm of upper-outer quadrant of right breast in female, estrogen receptor positive (HCC) 12/26/2019: T1BNX stage Ia grade 2 IDC ER/PR positive HER2 negative Ki-67 5% 02/19/2020: Right lumpectomy with sentinel lymph node biopsy: T1 BN 0 stage Ia grade 2 IDC negative margins 0/2 lymph nodes negative, Oncotype DX score 5 04/11/2020: Completed adjuvant radiation   Current treatment: Tamoxifen  started 05/23/2020 Tamoxifen  toxicities: Intermittent constipation but otherwise tolerating tamoxifen   extremely well. Duration of tamoxifen  therapy: I recommended that we obtain breast cancer index next year to determine if she would benefit from extended endocrine therapy.   Breast cancer surveillance: Breast MRI 08/22/2024: Benign breast density category D Mammograms 02/11/2024: Benign, breast density category D, 2 liver lesions noted 1 of which was 1.5 cm.  Felt to be cysts.  Liver lesions: Patient is extremely anxious and worried about it.  We will obtain a dedicated liver MRI for further assessment.   Recommended annual MRIs for surveillance given high breast density. Telephone visit 1 week after the liver MRI to discuss results     Follow-up in 1 year.  ------------------------------------- Assessment and Plan Assessment & Plan  History of malignant neoplasm of right breast, status post lumpectomy, on tamoxifen  therapy Currently on tamoxifen  therapy for breast cancer. Concerns about side effects, including constipation and weight gain. Breast cancer index test discussed to evaluate late recurrence risk and benefit of continuing tamoxifen . Breast density remains high, necessitating continued MRI surveillance. - Continue tamoxifen  therapy. - Discuss breast cancer index test at next appointment. - Continue annual breast MRI due to persistent breast density. - Provided prescription for silicone breast inserts.  Benign hepatic cysts under surveillance Two hepatic cysts identified on MRI, measuring 0.4 cm and 1.5 cm, with no enhancement, likely benign. Previous MRIs did not capture these cysts due to imaging limitations. She expresses concern due to personal and family history of cancer. - Ordered dedicated liver MRI for further evaluation of hepatic cysts.  Constipation likely related to tamoxifen  therapy Chronic constipation likely related to tamoxifen  therapy. She follows a vegetarian diet with high fiber intake but continues to experience constipation.      No orders of the  defined types were placed in this encounter.  The patient has a good understanding of the overall plan. she agrees with it. she will call with any problems that may develop before the next visit here.  I personally spent a total of 30 minutes in the care of the patient today including preparing to see the patient, getting/reviewing separately obtained history, performing a medically appropriate exam/evaluation, counseling and educating, placing orders, referring and communicating with other health care professionals, documenting clinical information in the EHR, independently interpreting results, communicating results, and coordinating care.   Viinay K Isaul Landi, MD 09/03/24

## 2024-09-05 ENCOUNTER — Encounter: Payer: Self-pay | Admitting: Plastic Surgery

## 2024-09-05 ENCOUNTER — Other Ambulatory Visit: Payer: Self-pay | Admitting: *Deleted

## 2024-09-05 ENCOUNTER — Other Ambulatory Visit: Admitting: Plastic Surgery

## 2024-09-05 MED ORDER — ALPRAZOLAM 0.5 MG PO TABS: 0.5000 mg | ORAL_TABLET | ORAL | 0 refills | Status: AC | PRN

## 2024-09-05 NOTE — Telephone Encounter (Signed)
 Received mychart message from pt requesting refill for Xanax  for claustrophobia related to upcoming MRI.  Verbal orders received and placed per MD.  Prescription sent to pharmacy on file, pt educated and verbalized understanding.

## 2024-09-09 ENCOUNTER — Other Ambulatory Visit: Admitting: Plastic Surgery

## 2024-09-11 ENCOUNTER — Other Ambulatory Visit: Payer: Self-pay | Admitting: Hematology and Oncology

## 2024-09-15 ENCOUNTER — Ambulatory Visit (INDEPENDENT_AMBULATORY_CARE_PROVIDER_SITE_OTHER): Admitting: Podiatry

## 2024-09-15 ENCOUNTER — Ambulatory Visit (INDEPENDENT_AMBULATORY_CARE_PROVIDER_SITE_OTHER)

## 2024-09-15 DIAGNOSIS — G5761 Lesion of plantar nerve, right lower limb: Secondary | ICD-10-CM

## 2024-09-15 DIAGNOSIS — M792 Neuralgia and neuritis, unspecified: Secondary | ICD-10-CM

## 2024-09-16 ENCOUNTER — Ambulatory Visit
Admission: RE | Admit: 2024-09-16 | Discharge: 2024-09-16 | Disposition: A | Discharge status: Home / Self Care | Source: Ambulatory Visit | Attending: Hematology and Oncology | Admitting: Hematology and Oncology

## 2024-09-16 DIAGNOSIS — K769 Liver disease, unspecified: Secondary | ICD-10-CM

## 2024-09-16 MED ORDER — GADOPICLENOL 0.5 MMOL/ML IV SOLN
6.0000 mL | Freq: Once | INTRAVENOUS | Status: AC | PRN
  Administered 2024-09-16: 6 mL via INTRAVENOUS

## 2024-09-17 NOTE — Progress Notes (Signed)
  Subjective:  Patient ID: Kara Kim, female    DOB: 1961-04-26,  MRN: [id number]  Chief Complaint  Patient presents with   Numbness    Right foot 2,3,4 toe is numb all the time for about a month. Non diabetic. Feels mostly at night on wood floors.    Discussed the use of AI scribe software for clinical note transcription with the patient, who gave verbal consent to proceed.  History of Present Illness Kara Kim is a 63 year old female who presents with numbness in the toes of her right foot.  She has numbness in the second, third, and fourth toes of her right foot, worse at night. It is absent when she wears sneakers during the day and is most noticeable when she is barefoot on hardwood floors. She denies pain, radiation, or recent trauma to the foot.  She had a fifth metatarsal fracture in the same foot about two years ago that occasionally aches. She currently wears running shoes with inserts after a past episode of plantar fasciitis and thinks different work shoes may have triggered the numbness.      Objective:  There were no vitals filed for this visit.  Physical Exam General: AAO x3, NAD  Dermatological: Skin is warm, dry and supple bilateral. There are no open sores, no preulcerative lesions, no rash or signs of infection present.  Vascular: Dorsalis Pedis artery and Posterior Tibial artery pedal pulses are 2/4 bilateral with immedate capillary fill time. There is no pain with calf compression, swelling, warmth, erythema.   Neruologic: Grossly intact via light touch bilateral.  Negative Tinel sign.  Musculoskeletal: There are no areas of pinpoint tenderness.  There is mild to spondylothird interspace and a small palpable neuroma versus bursa present.  Unable to proceed any area pinpoint tenderness.  There is prominence of the metatarsal heads plantarly with atrophy of the fat pad.     No images are attached to the encounter.    Results Procedure: Skin  scraping Description: Dry skin was debrided from the foot to check for any foreign bodies.   Assessment:   1. Morton neuroma of right foot   2. Neuritis      Plan:  Patient was evaluated and treated and all questions answered.  Assessment and Plan Assessment & Plan Neuroma of right foot Numbness in second, third, and fourth toes, more pronounced at night. Suspected neuroma between third and fourth metatarsals due to nerve inflammation or enlargement. - X-rays were obtained reviewed.  Multiple views obtained.  No evidence of acute fracture.  Healed fifth metatarsal fracture. - Started anti-inflammatory medication as previously prescribed.  Already prescribed Celebrex . - Advised against going barefoot at home to reduce pressure on metatarsal heads. - Dispensed metatarsal pads and gel metatarsal pads to alleviate pressure.   Return in about 4 weeks (around 10/13/2024), or if symptoms worsen or fail to improve.   [name] Fees DPM

## 2024-09-22 ENCOUNTER — Inpatient Hospital Stay: Admitting: Hematology and Oncology

## 2024-09-22 DIAGNOSIS — D1803 Hemangioma of intra-abdominal structures: Secondary | ICD-10-CM

## 2024-09-22 DIAGNOSIS — Z170 Estrogen receptor positive status [ER+]: Secondary | ICD-10-CM | POA: Diagnosis not present

## 2024-09-22 DIAGNOSIS — Z79810 Long term (current) use of selective estrogen receptor modulators (SERMs): Secondary | ICD-10-CM

## 2024-09-22 DIAGNOSIS — C50411 Malignant neoplasm of upper-outer quadrant of right female breast: Secondary | ICD-10-CM | POA: Diagnosis not present

## 2024-09-22 DIAGNOSIS — Z1721 Progesterone receptor positive status: Secondary | ICD-10-CM | POA: Diagnosis not present

## 2024-09-22 DIAGNOSIS — Z1732 Human epidermal growth factor receptor 2 negative status: Secondary | ICD-10-CM

## 2024-09-22 DIAGNOSIS — K5904 Chronic idiopathic constipation: Secondary | ICD-10-CM

## 2024-09-22 DIAGNOSIS — M8580 Other specified disorders of bone density and structure, unspecified site: Secondary | ICD-10-CM

## 2024-09-22 NOTE — Progress Notes (Signed)
 HEMATOLOGY-ONCOLOGY TELEPHONE VISIT PROGRESS NOTE  I connected with our patient on 09/22/24 at  2:45 PM EST by telephone and verified that I am speaking with the correct person using two identifiers.  I discussed the limitations, risks, security and privacy concerns of performing an evaluation and management service by telephone and the availability of in person appointments.  I also discussed with the patient that there may be a patient responsible charge related to this service. The patient expressed understanding and agreed to proceed.   History of Present Illness: Follow-up to discuss results of liver MRI  History of Present Illness Kara Kim is a 63 year old female who presents with chronic constipation and for discussion of her liver MRI results.  She has chronic constipation that began after starting tamoxifen  and is concerned tamoxifen  may be contributing. She uses magnesium and Nerolux for bowel regulation but finds this regimen inconvenient.    Oncology History  Malignant neoplasm of upper-outer quadrant of right breast in female, estrogen receptor positive (HCC)  12/26/2019 Cancer Staging   Staging form: Breast, AJCC 8th Edition - Clinical stage from 12/26/2019: Stage IA (cT1b, cN0, cM0, G2, ER+, PR+, HER2-) - Signed by Crawford Morna Pickle, NP on 01/07/2020   01/07/2020 Initial Diagnosis   Malignant neoplasm of upper-outer quadrant of right breast in female, estrogen receptor positive (HCC)     REVIEW OF SYSTEMS:   Constitutional: Denies fevers, chills or abnormal weight loss All other systems were reviewed with the patient and are negative. Observations/Objective:     Assessment Plan:  Malignant neoplasm of upper-outer quadrant of right breast in female, estrogen receptor positive (HCC) 12/26/2019: T1BNX stage Ia grade 2 IDC ER/PR positive HER2 negative Ki-67 5% 02/19/2020: Right lumpectomy with sentinel lymph node biopsy: T1 BN 0 stage Ia grade 2 IDC negative  margins 0/2 lymph nodes negative, Oncotype DX score 5 04/11/2020: Completed adjuvant radiation   Current treatment: Tamoxifen  started 05/23/2020 Tamoxifen  toxicities: Intermittent constipation but otherwise tolerating tamoxifen  extremely well. She will stop tamoxifen  for 2 weeks and see if this is the cause  Duration of tamoxifen  therapy: I recommended that we obtain breast cancer index next year to determine if she would benefit from extended endocrine therapy. We will run this text next in 2026.   Breast cancer surveillance: Breast MRI 08/22/2024: Benign breast density category D Mammograms 02/11/2024: Benign, breast density category D, 2 liver lesions noted 1 of which was 1.5 cm.  Felt to be cysts. Breast MRI 08/22/2024: Benign, hepatic cysts   Liver lesions: Liver MRI: 2 hemangiomas of the liver 1.6 cm, additional multiple small cysts throughout the liver   I reassured her that these liver lesions are benign.  We do not need to do anything further with the liver.  There is no need to do additional imaging in the future for this condition.     Follow-up in 1 year.   Assessment & Plan Estrogen receptor positive right breast cancer Currently on tamoxifen  with chronic constipation as a potential side effect. Trial off medication advised to confirm causality. Alternative treatments may be considered if constipation persists, considering osteopenia. - Discontinue tamoxifen  for two weeks to assess if constipation resolves. - If constipation resolves, resume tamoxifen . - If constipation persists, consider alternative treatments, considering osteopenia.  Chronic constipation Potentially related to tamoxifen . Managed with magnesium and Nerolux. Trial off tamoxifen  advised to determine causality. - Discontinue tamoxifen  for two weeks to assess if constipation resolves. - If constipation resolves, resume tamoxifen . - If  constipation persists, consider alternative treatments.  Benign hepatic  hemangiomas and cysts Liver MRI shows benign hepatic hemangiomas and small cysts, common and require no intervention. - No further imaging or intervention required.  Osteopenia Present, particularly in the femoral neck. Bone density results not available. Alternative treatments for breast cancer may need to consider osteopenia. - Review bone density results from external provider. - Consider alternative treatments for breast cancer compatible with osteopenia.      I discussed the assessment and treatment plan with the patient. The patient was provided an opportunity to ask questions and all were answered. The patient agreed with the plan and demonstrated an understanding of the instructions. The patient was advised to call back or seek an in-person evaluation if the symptoms worsen or if the condition fails to improve as anticipated.   I provided 20 minutes of non-face-to-face time during this encounter.  This includes time for charting and coordination of care   Naomi MARLA Chad, MD

## 2024-09-22 NOTE — Assessment & Plan Note (Signed)
 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 Duration of tamoxifen  therapy: I recommended that we obtain breast cancer index next year to determine if she would benefit from extended endocrine therapy.   Breast cancer surveillance: Breast MRI 08/22/2024: Benign breast density category D Mammograms 02/11/2024: Benign, breast density category D, 2 liver lesions noted 1 of which was 1.5 cm.  Felt to be cysts. Breast MRI 08/22/2024: Benign, hepatic cysts   Liver lesions: Liver MRI: 2 hemangiomas of the liver 1.6 cm, additional multiple small cysts throughout the liver   I reassured her that these liver lesions are benign.  We do not need to do anything further with the liver.  There is no need to do additional imaging in the future for this condition.     Follow-up in 1 year.

## 2024-10-03 DIAGNOSIS — Z719 Counseling, unspecified: Secondary | ICD-10-CM

## 2024-10-13 ENCOUNTER — Ambulatory Visit: Admitting: Podiatry

## 2024-11-11 ENCOUNTER — Other Ambulatory Visit: Admitting: Plastic Surgery

## 2024-12-26 ENCOUNTER — Other Ambulatory Visit: Admitting: Plastic Surgery

## 2025-08-14 ENCOUNTER — Other Ambulatory Visit: Admitting: Plastic Surgery

## 2025-08-25 ENCOUNTER — Other Ambulatory Visit

## 2025-09-03 ENCOUNTER — Inpatient Hospital Stay: Admitting: Hematology and Oncology
